# Patient Record
Sex: Female | Born: 1995 | Race: Black or African American | Hispanic: No | Marital: Single | Smoking: Current every day smoker
Health system: Southern US, Community
[De-identification: ages and names within clinical notes are randomized; demographics above are authoritative.]

## PROBLEM LIST (undated history)

## (undated) DIAGNOSIS — J45909 Unspecified asthma, uncomplicated: Secondary | ICD-10-CM

---
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
## 2016-07-20 ENCOUNTER — Emergency Department (HOSPITAL_COMMUNITY)
Admission: EM | Admit: 2016-07-20 | Discharge: 2016-07-20 | Disposition: A | Discharge status: Home / Self Care | Payer: Self-pay | Attending: Emergency Medicine | Admitting: Emergency Medicine

## 2016-07-20 ENCOUNTER — Encounter (HOSPITAL_COMMUNITY): Payer: Self-pay | Admitting: Emergency Medicine

## 2016-07-20 DIAGNOSIS — Y929 Unspecified place or not applicable: Secondary | ICD-10-CM | POA: Insufficient documentation

## 2016-07-20 DIAGNOSIS — W504XXA Accidental scratch by another person, initial encounter: Secondary | ICD-10-CM | POA: Insufficient documentation

## 2016-07-20 DIAGNOSIS — Y999 Unspecified external cause status: Secondary | ICD-10-CM | POA: Insufficient documentation

## 2016-07-20 DIAGNOSIS — J45909 Unspecified asthma, uncomplicated: Secondary | ICD-10-CM | POA: Insufficient documentation

## 2016-07-20 DIAGNOSIS — L03114 Cellulitis of left upper limb: Secondary | ICD-10-CM | POA: Insufficient documentation

## 2016-07-20 DIAGNOSIS — Y939 Activity, unspecified: Secondary | ICD-10-CM | POA: Insufficient documentation

## 2016-07-20 MED ORDER — CLINDAMYCIN HCL 150 MG PO CAPS: 150.0000 mg | ORAL_CAPSULE | Freq: Three times a day (TID) | ORAL | 0 refills | Status: DC

## 2016-07-20 MED ORDER — HYDROCODONE-ACETAMINOPHEN 5-325 MG PO TABS: 1.0000 | ORAL_TABLET | ORAL | 0 refills | Status: DC | PRN

## 2016-07-20 MED ORDER — CLINDAMYCIN HCL 150 MG PO CAPS
300.0000 mg | ORAL_CAPSULE | Freq: Once | ORAL | Status: AC
  Administered 2016-07-20: 300 mg via ORAL
  Filled 2016-07-20: qty 2

## 2016-07-20 MED ORDER — LIDOCAINE HCL (PF) 1 % IJ SOLN
5.0000 mL | Freq: Once | INTRAMUSCULAR | Status: DC
  Filled 2016-07-20: qty 5

## 2016-07-20 MED ORDER — ONDANSETRON 4 MG PO TBDP
4.0000 mg | ORAL_TABLET | Freq: Once | ORAL | Status: AC
  Administered 2016-07-20: 4 mg via ORAL
  Filled 2016-07-20: qty 1

## 2016-07-20 MED ORDER — OXYCODONE-ACETAMINOPHEN 5-325 MG PO TABS
1.0000 | ORAL_TABLET | Freq: Once | ORAL | Status: AC
  Administered 2016-07-20: 1 via ORAL
  Filled 2016-07-20: qty 1

## 2016-07-20 MED ORDER — SODIUM BICARBONATE 4 % IV SOLN
5.0000 mL | Freq: Once | INTRAVENOUS | Status: DC
  Filled 2016-07-20: qty 5

## 2016-07-20 NOTE — Progress Notes (Signed)
Orthopedic Tech Progress Note Patient Details:  Erika Benton 1996/05/15 [id number]  Ortho Devices Type of Ortho Device: Arm sling Ortho Device/Splint Interventions: Application   Saul FordyceJennifer C Hiliana Eilts 07/20/2016, 8:32 AM

## 2016-07-20 NOTE — Discharge Instructions (Signed)
You have a wound to the back of your hand that is likely infected after being scratched. At this time there does not appear to be fluid collected that I can drain. Please soak your hand in warm EPSOM salt baths multiple times a day. Take the antibiotic till it is complete even if the pain and swelling goes away. Take the pain medication as needed. If you feel like the area gets bigger or starts to spread or become more painful come back to the ER. If you start getting fevers or vomiting then that is also concern the infection may have spread and I want you to come back to the ER.

## 2016-07-20 NOTE — ED Triage Notes (Signed)
Pt reports two days ago getting scratched on the left hand by a fingernail. Two of the scratches are scabbed over and the third scratch is throbbing. Pt reports limited movement in left wrist and fingers due to pain. Radial pulse 4+, cap refill WNL.

## 2016-07-20 NOTE — ED Provider Notes (Signed)
[facility]-EMERGENCY DEPT Provider Note   CSN: [id number] Arrival date & time: 07/20/16  0636  History   Chief Complaint Chief Complaint  Patient presents with  . Hand Injury    HPI Erika Benton is a 21 y.o. female.  HPI  Patient with PMH of asthma comes to the ER with pain to the back of her hand with throbbing. She was scratched by human fingernails to the back of the left hand, they have all scabbed over but one of the scratches is throbbing. No drainage. No fevers, N/V/D, no pain up towards wrist or elbow.  Past Medical History:  Diagnosis Date  . Asthma     There are no active problems to display for this patient.   History reviewed. No pertinent surgical history.  OB History    No data available       Home Medications    Prior to Admission medications   Medication Sig Start Date End Date Taking? Authorizing Provider  clindamycin (CLEOCIN) 150 MG capsule Take 1 capsule (150 mg total) by mouth 3 (three) times daily. 07/20/16   Marlon Pel, PA-C  HYDROcodone-acetaminophen (NORCO/VICODIN) 5-325 MG tablet Take 1-2 tablets by mouth every 4 (four) hours as needed. 07/20/16   Marlon Pel, PA-C  trimethoprim-polymyxin b (POLYTRIM) ophthalmic solution Place 1 drop into the left eye every 6 (six) hours. X 7 days qs 03/28/14   Marcellina Millin, MD    Family History No family history on file.  Social History Social History  Substance Use Topics  . Smoking status: Never Smoker  . Smokeless tobacco: Never Used  . Alcohol use No     Allergies   Patient has no known allergies.   Review of Systems Review of Systems Review of Systems All other systems negative except as documented in the HPI. All pertinent positives and negatives as reviewed in the HPI.   Physical Exam Updated Vital Signs BP 93/60 (BP Location: Right Arm)   Pulse 88   Temp 98.3 F (36.8 C) (Oral)   Resp 16   Ht 5\' 7"  (1.702 m)   Wt 54.4 kg   LMP 07/13/2016   SpO2 100%   BMI 18.79  kg/m   Physical Exam  Constitutional: She appears well-developed and well-nourished. No distress.  HENT:  Head: Normocephalic and atraumatic.  Eyes: Pupils are equal, round, and reactive to light.  Neck: Normal range of motion. Neck supple.  Cardiovascular: Normal rate and regular rhythm.   Pulmonary/Chest: Effort normal.  Abdominal: Soft.  Musculoskeletal:  Pt has some tenderness swelling, erythema, and induration to posterior hand associated with one of the scratches. She has full extension and flexion of all 5 fingers but does have pain with movement of fingers. No pain to wrist or elbow. No fluctuance, wound is firm. Normal anterior hand. No swelling into fingers.  Neurological: She is alert.  Skin: Skin is warm and dry.  Nursing note and vitals reviewed.    ED Treatments / Results  Labs (all labs ordered are listed, but only abnormal results are displayed) Labs Reviewed - No data to display  EKG  EKG Interpretation None       Radiology No results found.  Procedures Procedures (including critical care time)  Medications Ordered in ED Medications  clindamycin (CLEOCIN) capsule 300 mg (300 mg Oral Given 07/20/16 0747)  ondansetron (ZOFRAN-ODT) disintegrating tablet 4 mg (4 mg Oral Given 07/20/16 0747)  oxyCODONE-acetaminophen (PERCOCET/ROXICET) 5-325 MG per tablet 1 tablet (1 tablet Oral Given 07/20/16  [id number])     Initial Impression / Assessment and Plan / ED Course  I have reviewed the triage vital signs and the nursing notes.  Pertinent labs & imaging results that were available during my care of the patient were reviewed by me and considered in my medical decision making (see chart for details).  Clinical Course     Will start on Clindamycin and have patient follow-up with hand with strict return to ED precautions. The wound does not appear amenable to drainage at this time with no obvious fluid collection. Will rx abx and pain medication.   Final Clinical  Impressions(s) / ED Diagnoses   Final diagnoses:  Cellulitis of left upper extremity    New Prescriptions New Prescriptions   CLINDAMYCIN (CLEOCIN) 150 MG CAPSULE    Take 1 capsule (150 mg total) by mouth 3 (three) times daily.   HYDROCODONE-ACETAMINOPHEN (NORCO/VICODIN) 5-325 MG TABLET    Take 1-2 tablets by mouth every 4 (four) hours as needed.     Marlon Peliffany Shunta Mclaurin, PA-C 07/20/16 [id number]    Gerhard Munchobert Lockwood, MD 07/20/16 [id number]

## 2017-09-04 ENCOUNTER — Encounter (HOSPITAL_COMMUNITY): Payer: Self-pay | Admitting: Emergency Medicine

## 2017-09-04 ENCOUNTER — Other Ambulatory Visit: Payer: Self-pay

## 2017-09-04 ENCOUNTER — Emergency Department (HOSPITAL_COMMUNITY)
Admission: EM | Admit: 2017-09-04 | Discharge: 2017-09-04 | Disposition: A | Discharge status: Home / Self Care | Payer: Self-pay | Attending: Emergency Medicine | Admitting: Emergency Medicine

## 2017-09-04 DIAGNOSIS — R102 Pelvic and perineal pain: Secondary | ICD-10-CM | POA: Insufficient documentation

## 2017-09-04 DIAGNOSIS — Z5321 Procedure and treatment not carried out due to patient leaving prior to being seen by health care provider: Secondary | ICD-10-CM | POA: Insufficient documentation

## 2017-09-04 LAB — I-STAT BETA HCG BLOOD, ED (MC, WL, AP ONLY)

## 2017-09-04 NOTE — ED Notes (Signed)
09/04/2017. 17:00, Attempted Follow-up call, no answer.

## 2017-09-04 NOTE — ED Triage Notes (Signed)
Patient with pelvic pain that started about one hour ago.  She denies any bleeding or discharge.  She states she was a little lightheaded with the pain on the way to the hospital.  She denies having sex at the time of the pain starting, she states she was "just chillin" when the pain started.

## 2017-09-04 NOTE — ED Notes (Signed)
Pts name called for a room no answer 

## 2017-09-04 NOTE — ED Notes (Signed)
Pt unable to void at this time. 

## 2018-11-25 ENCOUNTER — Emergency Department (HOSPITAL_COMMUNITY)
Admission: EM | Admit: 2018-11-25 | Discharge: 2018-11-25 | Disposition: A | Discharge status: Home / Self Care | Payer: Self-pay | Attending: Emergency Medicine | Admitting: Emergency Medicine

## 2018-11-25 ENCOUNTER — Encounter (HOSPITAL_COMMUNITY): Payer: Self-pay | Admitting: Emergency Medicine

## 2018-11-25 ENCOUNTER — Other Ambulatory Visit: Payer: Self-pay

## 2018-11-25 DIAGNOSIS — J45909 Unspecified asthma, uncomplicated: Secondary | ICD-10-CM | POA: Insufficient documentation

## 2018-11-25 DIAGNOSIS — Z79899 Other long term (current) drug therapy: Secondary | ICD-10-CM | POA: Insufficient documentation

## 2018-11-25 DIAGNOSIS — F17200 Nicotine dependence, unspecified, uncomplicated: Secondary | ICD-10-CM | POA: Insufficient documentation

## 2018-11-25 DIAGNOSIS — L02416 Cutaneous abscess of left lower limb: Secondary | ICD-10-CM | POA: Insufficient documentation

## 2018-11-25 MED ORDER — SODIUM BICARBONATE 4 % IV SOLN
5.0000 mL | Freq: Once | INTRAVENOUS | Status: AC
  Administered 2018-11-25: 5 mL via SUBCUTANEOUS
  Filled 2018-11-25: qty 5

## 2018-11-25 MED ORDER — LIDOCAINE HCL (PF) 1 % IJ SOLN
30.0000 mL | Freq: Once | INTRAMUSCULAR | Status: AC
  Administered 2018-11-25: 30 mL
  Filled 2018-11-25: qty 30

## 2018-11-25 NOTE — ED Notes (Signed)
All items at bedside for PA

## 2018-11-25 NOTE — ED Triage Notes (Signed)
Patient reports "popping bump" on left lower leg x5 days ago. Reports pain and swelling at site started x2 days ago.

## 2018-11-25 NOTE — ED Provider Notes (Signed)
[facility]-EMERGENCY DEPT Provider Note   CSN: [id number] Arrival date & time: 11/25/18  1953    History   Chief Complaint Chief Complaint  Patient presents with  . Leg Pain    HPI Erika Benton is a 23 y.o. female      Abscess  Location:  Leg Leg abscess location:  L leg Size:  8 cm Abscess quality: fluctuance, induration, painful, redness and warmth   Progression:  Worsening Pain details:    Quality:  Pressure   Severity:  Moderate   Timing:  Constant   Progression:  Worsening Chronicity:  New Context: skin injury   Context: not diabetes, not immunosuppression, not injected drug use and not insect bite/sting   Relieved by:  Nothing Ineffective treatments:  None tried Associated symptoms: no anorexia, no fatigue, no fever, no headaches, no nausea and no vomiting   Risk factors: no hx of MRSA and no prior abscess     Past Medical History:  Diagnosis Date  . Asthma     There are no active problems to display for this patient.   History reviewed. No pertinent surgical history.   OB History   No obstetric history on file.      Home Medications    Prior to Admission medications   Medication Sig Start Date End Date Taking? Authorizing Provider  clindamycin (CLEOCIN) 150 MG capsule Take 1 capsule (150 mg total) by mouth 3 (three) times daily. 07/20/16   Marlon PelGreene, Tiffany, PA-C  HYDROcodone-acetaminophen (NORCO/VICODIN) 5-325 MG tablet Take 1-2 tablets by mouth every 4 (four) hours as needed. 07/20/16   Marlon PelGreene, Tiffany, PA-C  trimethoprim-polymyxin b (POLYTRIM) ophthalmic solution Place 1 drop into the left eye every 6 (six) hours. X 7 days qs 03/28/14   Marcellina MillinGaley, Timothy, MD    Family History No family history on file.  Social History Social History   Tobacco Use  . Smoking status: Current Every Day Smoker  . Smokeless tobacco: Never Used  Substance Use Topics  . Alcohol use: No  . Drug use: Not on file     Allergies    Patient has no known allergies.   Review of Systems Review of Systems  Constitutional: Negative for chills, fatigue and fever.  Gastrointestinal: Negative for anorexia, nausea and vomiting.  Skin:       abscess  Neurological: Negative for headaches.     Physical Exam Updated Vital Signs BP 121/74 (BP Location: Right Arm)   Pulse 88   Temp 98.3 F (36.8 C) (Oral)   Resp 15   SpO2 100%   Physical Exam Vitals signs and nursing note reviewed.  Constitutional:      General: She is not in acute distress.    Appearance: She is well-developed. She is not diaphoretic.  HENT:     Head: Normocephalic and atraumatic.  Eyes:     General: No scleral icterus.    Conjunctiva/sclera: Conjunctivae normal.  Neck:     Musculoskeletal: Normal range of motion.  Cardiovascular:     Rate and Rhythm: Normal rate and regular rhythm.     Heart sounds: Normal heart sounds. No murmur. No friction rub. No gallop.   Pulmonary:     Effort: Pulmonary effort is normal. No respiratory distress.     Breath sounds: Normal breath sounds.  Abdominal:     General: Bowel sounds are normal. There is no distension.     Palpations: Abdomen is soft. There is no mass.  Tenderness: There is no abdominal tenderness. There is no guarding.  Skin:    General: Skin is warm and dry.     Capillary Refill: Capillary refill takes less than 2 seconds.     Comments: LLE 6 cm abscess with central fluctuance 1 cm of surrounding induratoin without lymphangitis.  Neurological:     Mental Status: She is alert and oriented to person, place, and time.  Psychiatric:        Behavior: Behavior normal.      ED Treatments / Results  Labs (all labs ordered are listed, but only abnormal results are displayed) Labs Reviewed - No data to display  EKG None  Radiology No results found.  Procedures Procedures (including critical care time)  Medications Ordered in ED Medications  sodium bicarbonate (NEUT) 4 %  injection 5 mL (has no administration in time range)  lidocaine (PF) (XYLOCAINE) 1 % injection 30 mL (has no administration in time range)     Initial Impression / Assessment and Plan / ED Course  I have reviewed the triage vital signs and the nursing notes.  Pertinent labs & imaging results that were available during my care of the patient were reviewed by me and considered in my medical decision making (see chart for details).        Patient with skin abscess amenable to incision and drainage.  Abscess was not large enough to warrant packing or drain,  wound recheck in 2 days. Encouraged home warm soaks and flushing.  Mild signs of cellulitis is surrounding skin.  Will d/c to home.  No antibiotic therapy is indicated.   Final Clinical Impressions(s) / ED Diagnoses   Final diagnoses:  Abscess of left lower extremity    ED Discharge Orders    None       Arthor Captain, PA-C 11/25/18 2327    Virgina Norfolk, DO 11/25/18 2331

## 2018-11-25 NOTE — Discharge Instructions (Signed)
Get help right away if: You have severe pain or bleeding. You cannot eat or drink without vomiting. You have decreased urine output. You become short of breath. You have chest pain. You cough up blood. The area where the incision and drainage occurred becomes numb or it tingles.   Contact a health care provider if you have: More redness, swelling, or pain around your abscess. More fluid or blood coming from your abscess. Warm skin around your abscess. More pus or a bad smell coming from your abscess. A fever. Muscle aches. Chills or a general ill feeling. Get help right away if you: Have severe pain. See red streaks on your skin spreading away from the abscess.

## 2018-11-25 NOTE — ED Notes (Signed)
ED Provider at bedside. 

## 2018-12-01 ENCOUNTER — Other Ambulatory Visit: Payer: Self-pay

## 2018-12-01 ENCOUNTER — Encounter (HOSPITAL_COMMUNITY): Payer: Self-pay

## 2018-12-01 ENCOUNTER — Emergency Department (HOSPITAL_COMMUNITY)
Admission: EM | Admit: 2018-12-01 | Discharge: 2018-12-01 | Disposition: A | Discharge status: Home / Self Care | Payer: Self-pay | Attending: Emergency Medicine | Admitting: Emergency Medicine

## 2018-12-01 DIAGNOSIS — F17200 Nicotine dependence, unspecified, uncomplicated: Secondary | ICD-10-CM | POA: Insufficient documentation

## 2018-12-01 DIAGNOSIS — L739 Follicular disorder, unspecified: Secondary | ICD-10-CM | POA: Insufficient documentation

## 2018-12-01 DIAGNOSIS — J45909 Unspecified asthma, uncomplicated: Secondary | ICD-10-CM | POA: Insufficient documentation

## 2018-12-01 MED ORDER — DOXYCYCLINE HYCLATE 100 MG PO CAPS: 100.0000 mg | ORAL_CAPSULE | Freq: Two times a day (BID) | ORAL | 0 refills | Status: DC

## 2018-12-01 NOTE — ED Triage Notes (Signed)
Pt states that she has another "bump" similar to the one that she had drained the other day. Today's "bump" is on her right lower leg. Pt states no pain, just doesn't want it to get as bad as the other leg.

## 2018-12-01 NOTE — ED Provider Notes (Signed)
[facility]-EMERGENCY DEPT Provider Note   CSN: [id number] Arrival date & time: 12/01/18  1120    History   Chief Complaint Chief Complaint  Patient presents with  . Abscess    HPI Erika Benton is a 23 y.o. female.     HPI Patient is a 23 year old female presents to the emergency department with a questionable developing abscess of her right lateral mid lower leg.  She states she recently had an abscess on her left leg drained and wanted this 1 evaluated before it became larger in size.  No drainage.  No fevers or chills.  No spreading redness.  No trauma or injury.  Symptoms are mild in severity.   Past Medical History:  Diagnosis Date  . Asthma     There are no active problems to display for this patient.   History reviewed. No pertinent surgical history.   OB History   No obstetric history on file.      Home Medications    Prior to Admission medications   Medication Sig Start Date End Date Taking? Authorizing Provider  doxycycline (VIBRAMYCIN) 100 MG capsule Take 1 capsule (100 mg total) by mouth 2 (two) times daily. 12/01/18   Azalia Bilis, MD    Family History No family history on file.  Social History Social History   Tobacco Use  . Smoking status: Current Every Day Smoker  . Smokeless tobacco: Never Used  Substance Use Topics  . Alcohol use: No  . Drug use: Not on file     Allergies   Patient has no known allergies.   Review of Systems Review of Systems  All other systems reviewed and are negative.    Physical Exam Updated Vital Signs BP 114/74 (BP Location: Right Arm)   Pulse 79   Temp 98.1 F (36.7 C) (Oral)   Resp 14   Ht 5\' 8"  (1.727 m)   Wt 54.4 kg   SpO2 100%   BMI 18.25 kg/m   Physical Exam Vitals signs and nursing note reviewed.  Constitutional:      Appearance: She is well-developed.  HENT:     Head: Normocephalic.  Neck:     Musculoskeletal: Normal range of motion.  Pulmonary:      Effort: Pulmonary effort is normal.  Abdominal:     General: There is no distension.  Musculoskeletal: Normal range of motion.     Comments: Superficial folliculitis of the right lower lateral mid leg.  Small purulent containing pustule overlying this.  No spreading erythema or warmth  Neurological:     Mental Status: She is alert and oriented to person, place, and time.      ED Treatments / Results  Labs (all labs ordered are listed, but only abnormal results are displayed) Labs Reviewed - No data to display  EKG None  Radiology No results found.  Procedures .Marland KitchenIncision and Drainage Performed by: Azalia Bilis, MD Authorized by: Azalia Bilis, MD     INCISION AND DRAINAGE Performed by: Azalia Bilis Consent: Verbal consent obtained. Risks and benefits: risks, benefits and alternatives were discussed Time out performed prior to procedure Type: abscess Body area: Right lateral leg  anesthesia: None  incision was made with an 18-gauge needle. Local anesthetic: Complexity: Simple Drainage: purulent Drainage amount: Small Packing material: None Patient tolerance: Patient tolerated the procedure well with no immediate complications.     Medications Ordered in ED Medications - No data to display   Initial Impression / Assessment  and Plan / ED Course  I have reviewed the triage vital signs and the nursing notes.  Pertinent labs & imaging results that were available during my care of the patient were reviewed by me and considered in my medical decision making (see chart for details).        Simple purulent filled pustule was opened with an 18-gauge needle.  Patient tolerated the procedure well.  Warm compress and antibiotics.  Final Clinical Impressions(s) / ED Diagnoses   Final diagnoses:  Folliculitis    ED Discharge Orders         Ordered    doxycycline (VIBRAMYCIN) 100 MG capsule  2 times daily     12/01/18 1205           Azalia Bilisampos, Leif Loflin, MD  12/01/18 1207

## 2018-12-01 NOTE — Discharge Instructions (Addendum)
Apply warm compresses to the area three times a day

## 2019-05-08 ENCOUNTER — Encounter (HOSPITAL_COMMUNITY): Payer: Self-pay | Admitting: Emergency Medicine

## 2019-05-08 ENCOUNTER — Emergency Department (HOSPITAL_COMMUNITY)
Admission: EM | Admit: 2019-05-08 | Discharge: 2019-05-08 | Disposition: A | Discharge status: Home / Self Care | Payer: Self-pay | Attending: Emergency Medicine | Admitting: Emergency Medicine

## 2019-05-08 ENCOUNTER — Emergency Department (HOSPITAL_COMMUNITY): Payer: Self-pay

## 2019-05-08 ENCOUNTER — Other Ambulatory Visit: Payer: Self-pay

## 2019-05-08 DIAGNOSIS — J45909 Unspecified asthma, uncomplicated: Secondary | ICD-10-CM | POA: Insufficient documentation

## 2019-05-08 DIAGNOSIS — Y929 Unspecified place or not applicable: Secondary | ICD-10-CM | POA: Insufficient documentation

## 2019-05-08 DIAGNOSIS — S92252B Displaced fracture of navicular [scaphoid] of left foot, initial encounter for open fracture: Secondary | ICD-10-CM | POA: Insufficient documentation

## 2019-05-08 DIAGNOSIS — S92212B Displaced fracture of cuboid bone of left foot, initial encounter for open fracture: Secondary | ICD-10-CM | POA: Insufficient documentation

## 2019-05-08 DIAGNOSIS — Y999 Unspecified external cause status: Secondary | ICD-10-CM | POA: Insufficient documentation

## 2019-05-08 DIAGNOSIS — S91332A Puncture wound without foreign body, left foot, initial encounter: Secondary | ICD-10-CM | POA: Insufficient documentation

## 2019-05-08 DIAGNOSIS — F17210 Nicotine dependence, cigarettes, uncomplicated: Secondary | ICD-10-CM | POA: Insufficient documentation

## 2019-05-08 DIAGNOSIS — Y939 Activity, unspecified: Secondary | ICD-10-CM | POA: Insufficient documentation

## 2019-05-08 DIAGNOSIS — W3400XA Accidental discharge from unspecified firearms or gun, initial encounter: Secondary | ICD-10-CM

## 2019-05-08 LAB — CBC WITH DIFFERENTIAL/PLATELET
Abs Immature Granulocytes: 0.01 10*3/uL (ref 0.00–0.07)
Basophils Absolute: 0.1 10*3/uL (ref 0.0–0.1)
Basophils Relative: 1 %
Eosinophils Absolute: 0.3 10*3/uL (ref 0.0–0.5)
Eosinophils Relative: 3 %
HCT: 43.8 % (ref 36.0–46.0)
Hemoglobin: 15.5 g/dL — ABNORMAL HIGH (ref 12.0–15.0)
Immature Granulocytes: 0 %
Lymphocytes Relative: 51 %
Lymphs Abs: 4.8 10*3/uL — ABNORMAL HIGH (ref 0.7–4.0)
MCH: 34.8 pg — ABNORMAL HIGH (ref 26.0–34.0)
MCHC: 35.4 g/dL (ref 30.0–36.0)
MCV: 98.2 fL (ref 80.0–100.0)
Monocytes Absolute: 0.4 10*3/uL (ref 0.1–1.0)
Monocytes Relative: 5 %
Neutro Abs: 3.6 10*3/uL (ref 1.7–7.7)
Neutrophils Relative %: 40 %
Platelets: 144 10*3/uL — ABNORMAL LOW (ref 150–400)
RBC: 4.46 MIL/uL (ref 3.87–5.11)
RDW: 12.4 % (ref 11.5–15.5)
WBC: 6.9 10*3/uL (ref 4.0–10.5)
nRBC: 0 % (ref 0.0–0.2)

## 2019-05-08 LAB — I-STAT BETA HCG BLOOD, ED (MC, WL, AP ONLY): I-stat hCG, quantitative: <5 m[IU]/mL (ref <5)

## 2019-05-08 LAB — BASIC METABOLIC PANEL
Anion gap: 15 (ref 5–15)
BUN: 11 mg/dL (ref 6–20)
CO2: 18 mmol/L — ABNORMAL LOW (ref 22–32)
Calcium: 9.2 mg/dL (ref 8.9–10.3)
Chloride: 105 mmol/L (ref 98–111)
Creatinine, Ser: 0.86 mg/dL (ref 0.44–1.00)
GFR calc Af Amer: >60 mL/min (ref >60)
GFR calc non Af Amer: >60 mL/min (ref >60)
Glucose, Bld: 169 mg/dL — ABNORMAL HIGH (ref 70–99)
Potassium: 3.1 mmol/L — ABNORMAL LOW (ref 3.5–5.1)
Sodium: 138 mmol/L (ref 135–145)

## 2019-05-08 MED ORDER — HYDROCODONE-ACETAMINOPHEN 5-325 MG PO TABS: 1.0000 | ORAL_TABLET | ORAL | 0 refills | Status: DC | PRN

## 2019-05-08 MED ORDER — OXYCODONE-ACETAMINOPHEN 5-325 MG PO TABS
1.0000 | ORAL_TABLET | Freq: Once | ORAL | Status: AC
  Administered 2019-05-08: 1 via ORAL
  Filled 2019-05-08: qty 1

## 2019-05-08 MED ORDER — HYDROCODONE-ACETAMINOPHEN 5-325 MG PO TABS
1.0000 | ORAL_TABLET | Freq: Once | ORAL | Status: AC
  Administered 2019-05-08: 1 via ORAL
  Filled 2019-05-08: qty 1

## 2019-05-08 NOTE — ED Notes (Signed)
Pt verbalized understanding of d/c instruction, scripts, follow up care and s/s requiring return to ED. No further questions at this time.

## 2019-05-08 NOTE — ED Notes (Signed)
Pt wound actively bleeding. Pressure dressing with ace bandage reapplied following MD assessment of wound.
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
## 2019-05-08 NOTE — Discharge Instructions (Addendum)
No weight bearing on your left foot - use crutches.  Keep your foot elevated as much as possible.  You may take ibuprofen 400 mg for less severe pain, or along with hydrocodone-acetaminophen for additional pain relief.
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
## 2019-05-10 ENCOUNTER — Encounter: Payer: Self-pay | Admitting: Orthopedic Surgery

## 2019-05-10 ENCOUNTER — Other Ambulatory Visit: Payer: Self-pay

## 2019-05-10 ENCOUNTER — Other Ambulatory Visit: Payer: Self-pay | Admitting: Physician Assistant

## 2019-05-10 ENCOUNTER — Ambulatory Visit (INDEPENDENT_AMBULATORY_CARE_PROVIDER_SITE_OTHER): Payer: Self-pay | Admitting: Orthopedic Surgery

## 2019-05-10 VITALS — Ht 68.0 in | Wt 120.0 lb

## 2019-05-10 DIAGNOSIS — S92902B Unspecified fracture of left foot, initial encounter for open fracture: Secondary | ICD-10-CM

## 2019-05-10 DIAGNOSIS — W3400XA Accidental discharge from unspecified firearms or gun, initial encounter: Secondary | ICD-10-CM

## 2019-05-10 DIAGNOSIS — S91332A Puncture wound without foreign body, left foot, initial encounter: Secondary | ICD-10-CM

## 2019-05-10 MED ORDER — HYDROCODONE-ACETAMINOPHEN 5-325 MG PO TABS: 1.0000 | ORAL_TABLET | ORAL | 0 refills | Status: DC | PRN

## 2019-05-10 MED ORDER — DOXYCYCLINE HYCLATE 100 MG PO TABS: 100.0000 mg | ORAL_TABLET | Freq: Two times a day (BID) | ORAL | 1 refills | Status: DC
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
## 2019-05-11 ENCOUNTER — Encounter (HOSPITAL_COMMUNITY): Payer: Self-pay | Admitting: *Deleted

## 2019-05-11 ENCOUNTER — Telehealth: Payer: Self-pay | Admitting: Physician Assistant

## 2019-05-11 NOTE — Telephone Encounter (Signed)
Received call from [facility] -pharmacist with [place] she advised patient just wanted to pick up the pain medicine and not the Doxycycline. The pharmacist told patient she needed to pick up both Rx's.  Said asked if it is ok for the patient to only pick up the Hydrocodone? The number to contact Michela Pitcher is [phone]
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
## 2019-05-11 NOTE — Telephone Encounter (Signed)
Pharmacy  was called and notified per Dr Jess Barters orders states patient should pick up both orders from pharmacy.

## 2019-05-12 ENCOUNTER — Encounter (HOSPITAL_COMMUNITY): Payer: Self-pay

## 2019-05-12 ENCOUNTER — Inpatient Hospital Stay (HOSPITAL_COMMUNITY): Payer: Self-pay | Admitting: Certified Registered"

## 2019-05-12 ENCOUNTER — Other Ambulatory Visit (HOSPITAL_COMMUNITY)
Admission: RE | Admit: 2019-05-12 | Discharge: 2019-05-12 | Disposition: A | Discharge status: Home / Self Care | Payer: Self-pay | Source: Ambulatory Visit | Attending: Orthopedic Surgery | Admitting: Orthopedic Surgery

## 2019-05-12 ENCOUNTER — Encounter (HOSPITAL_COMMUNITY)
Admission: RE | Disposition: A | Discharge status: Home / Self Care | Payer: Self-pay | Source: Home / Self Care | Attending: Orthopedic Surgery

## 2019-05-12 ENCOUNTER — Other Ambulatory Visit: Payer: Self-pay

## 2019-05-12 ENCOUNTER — Inpatient Hospital Stay (HOSPITAL_COMMUNITY)
Admission: RE | Admit: 2019-05-12 | Discharge: 2019-05-14 | DRG: 505 | Disposition: A | Discharge status: Home / Self Care | Payer: Self-pay | Attending: Orthopedic Surgery | Admitting: Orthopedic Surgery

## 2019-05-12 DIAGNOSIS — W320XXA Accidental handgun discharge, initial encounter: Secondary | ICD-10-CM

## 2019-05-12 DIAGNOSIS — F17210 Nicotine dependence, cigarettes, uncomplicated: Secondary | ICD-10-CM | POA: Diagnosis present

## 2019-05-12 DIAGNOSIS — S92902A Unspecified fracture of left foot, initial encounter for closed fracture: Secondary | ICD-10-CM | POA: Diagnosis present

## 2019-05-12 DIAGNOSIS — S92122A Displaced fracture of body of left talus, initial encounter for closed fracture: Secondary | ICD-10-CM | POA: Diagnosis present

## 2019-05-12 DIAGNOSIS — J45909 Unspecified asthma, uncomplicated: Secondary | ICD-10-CM | POA: Diagnosis present

## 2019-05-12 DIAGNOSIS — S92252A Displaced fracture of navicular [scaphoid] of left foot, initial encounter for closed fracture: Principal | ICD-10-CM | POA: Diagnosis present

## 2019-05-12 DIAGNOSIS — S92902B Unspecified fracture of left foot, initial encounter for open fracture: Secondary | ICD-10-CM

## 2019-05-12 DIAGNOSIS — Z20828 Contact with and (suspected) exposure to other viral communicable diseases: Secondary | ICD-10-CM | POA: Diagnosis present

## 2019-05-12 DIAGNOSIS — S92212A Displaced fracture of cuboid bone of left foot, initial encounter for closed fracture: Secondary | ICD-10-CM | POA: Diagnosis present

## 2019-05-12 HISTORY — PX: ORIF ANKLE FRACTURE: SHX5408

## 2019-05-12 HISTORY — PX: APPLICATION OF WOUND VAC: SHX5189

## 2019-05-12 LAB — POCT PREGNANCY, URINE: Preg Test, Ur: NEGATIVE

## 2019-05-12 LAB — SARS CORONAVIRUS 2 BY RT PCR (HOSPITAL ORDER, PERFORMED IN ~~LOC~~ HOSPITAL LAB): SARS Coronavirus 2: NEGATIVE

## 2019-05-12 SURGERY — OPEN REDUCTION INTERNAL FIXATION (ORIF) ANKLE FRACTURE
Anesthesia: General | Site: Foot | Laterality: Left

## 2019-05-12 MED ORDER — ONDANSETRON HCL 4 MG PO TABS: 4.0000 mg | ORAL_TABLET | Freq: Four times a day (QID) | ORAL | Status: DC | PRN

## 2019-05-12 MED ORDER — OXYCODONE HCL 5 MG PO TABS
5.0000 mg | ORAL_TABLET | ORAL | Status: DC | PRN
  Administered 2019-05-13: 07:00:00 5 mg via ORAL
  Filled 2019-05-12: qty 1

## 2019-05-12 MED ORDER — CEFAZOLIN SODIUM-DEXTROSE 2-4 GM/100ML-% IV SOLN
2.0000 g | INTRAVENOUS | Status: AC
  Administered 2019-05-12: 2 g via INTRAVENOUS

## 2019-05-12 MED ORDER — MIDAZOLAM HCL 2 MG/2ML IJ SOLN
2.0000 mg | Freq: Once | INTRAMUSCULAR | Status: AC
  Administered 2019-05-12: 2 mg via INTRAVENOUS

## 2019-05-12 MED ORDER — CEFAZOLIN SODIUM-DEXTROSE 1-4 GM/50ML-% IV SOLN
1.0000 g | Freq: Four times a day (QID) | INTRAVENOUS | Status: AC
  Administered 2019-05-12 – 2019-05-13 (×2): 1 g via INTRAVENOUS
  Filled 2019-05-12 (×3): qty 50

## 2019-05-12 MED ORDER — FENTANYL CITRATE (PF) 100 MCG/2ML IJ SOLN
INTRAMUSCULAR | Status: AC
  Filled 2019-05-12: qty 2

## 2019-05-12 MED ORDER — ACETAMINOPHEN 500 MG PO TABS: 1000.0000 mg | ORAL_TABLET | Freq: Once | ORAL | Status: DC

## 2019-05-12 MED ORDER — METOCLOPRAMIDE HCL 5 MG PO TABS: 5.0000 mg | ORAL_TABLET | Freq: Three times a day (TID) | ORAL | Status: DC | PRN

## 2019-05-12 MED ORDER — METHOCARBAMOL 500 MG PO TABS
500.0000 mg | ORAL_TABLET | Freq: Four times a day (QID) | ORAL | Status: DC | PRN
  Administered 2019-05-13 – 2019-05-14 (×2): 500 mg via ORAL
  Filled 2019-05-12 (×3): qty 1

## 2019-05-12 MED ORDER — PHENYLEPHRINE HCL (PRESSORS) 10 MG/ML IV SOLN
INTRAVENOUS | Status: DC | PRN
  Administered 2019-05-12 (×2): 80 ug via INTRAVENOUS

## 2019-05-12 MED ORDER — ONDANSETRON HCL 4 MG/2ML IJ SOLN: 4.0000 mg | Freq: Four times a day (QID) | INTRAMUSCULAR | Status: DC | PRN

## 2019-05-12 MED ORDER — BUPIVACAINE-EPINEPHRINE (PF) 0.5% -1:200000 IJ SOLN
INTRAMUSCULAR | Status: DC | PRN
  Administered 2019-05-12: 10 mL via PERINEURAL
  Administered 2019-05-12: 15 mL via PERINEURAL

## 2019-05-12 MED ORDER — POVIDONE-IODINE 10 % EX SWAB: 2.0000 "application " | Freq: Once | CUTANEOUS | Status: DC

## 2019-05-12 MED ORDER — OXYCODONE HCL 5 MG PO TABS
10.0000 mg | ORAL_TABLET | ORAL | Status: DC | PRN
  Administered 2019-05-13 – 2019-05-14 (×6): 15 mg via ORAL
  Filled 2019-05-12 (×6): qty 3

## 2019-05-12 MED ORDER — FENTANYL CITRATE (PF) 100 MCG/2ML IJ SOLN
INTRAMUSCULAR | Status: DC | PRN
  Administered 2019-05-12 (×2): 50 ug via INTRAVENOUS

## 2019-05-12 MED ORDER — ACETAMINOPHEN 325 MG PO TABS: 325.0000 mg | ORAL_TABLET | Freq: Four times a day (QID) | ORAL | Status: DC | PRN

## 2019-05-12 MED ORDER — LACTATED RINGERS IV SOLN: INTRAVENOUS | Status: DC

## 2019-05-12 MED ORDER — LACTATED RINGERS IV SOLN
INTRAVENOUS | Status: DC
  Administered 2019-05-12 (×2): via INTRAVENOUS

## 2019-05-12 MED ORDER — MAGNESIUM CITRATE PO SOLN: 1.0000 | Freq: Once | ORAL | Status: DC | PRN

## 2019-05-12 MED ORDER — PROPOFOL 10 MG/ML IV BOLUS
INTRAVENOUS | Status: DC | PRN
  Administered 2019-05-12: 40 mg via INTRAVENOUS
  Administered 2019-05-12: 120 mg via INTRAVENOUS
  Administered 2019-05-12: 40 mg via INTRAVENOUS

## 2019-05-12 MED ORDER — PROMETHAZINE HCL 25 MG/ML IJ SOLN: 6.2500 mg | INTRAMUSCULAR | Status: DC | PRN

## 2019-05-12 MED ORDER — BISACODYL 10 MG RE SUPP: 10.0000 mg | Freq: Every day | RECTAL | Status: DC | PRN

## 2019-05-12 MED ORDER — ONDANSETRON HCL 4 MG/2ML IJ SOLN
INTRAMUSCULAR | Status: DC | PRN
  Administered 2019-05-12: 4 mg via INTRAVENOUS

## 2019-05-12 MED ORDER — LIDOCAINE 2% (20 MG/ML) 5 ML SYRINGE
INTRAMUSCULAR | Status: AC
  Filled 2019-05-12: qty 10

## 2019-05-12 MED ORDER — METOCLOPRAMIDE HCL 5 MG/ML IJ SOLN: 5.0000 mg | Freq: Three times a day (TID) | INTRAMUSCULAR | Status: DC | PRN

## 2019-05-12 MED ORDER — SODIUM CHLORIDE 0.9 % IV SOLN
INTRAVENOUS | Status: DC
  Administered 2019-05-12: 21:00:00 via INTRAVENOUS

## 2019-05-12 MED ORDER — OXYCODONE HCL 5 MG/5ML PO SOLN: 5.0000 mg | Freq: Once | ORAL | Status: DC | PRN

## 2019-05-12 MED ORDER — FENTANYL CITRATE (PF) 100 MCG/2ML IJ SOLN: 25.0000 ug | INTRAMUSCULAR | Status: DC | PRN

## 2019-05-12 MED ORDER — HYDROMORPHONE HCL 1 MG/ML IJ SOLN
0.5000 mg | INTRAMUSCULAR | Status: DC | PRN
  Administered 2019-05-13: 09:00:00 1 mg via INTRAVENOUS
  Filled 2019-05-12: qty 1

## 2019-05-12 MED ORDER — FENTANYL CITRATE (PF) 250 MCG/5ML IJ SOLN
INTRAMUSCULAR | Status: AC
  Filled 2019-05-12: qty 5

## 2019-05-12 MED ORDER — DEXAMETHASONE SODIUM PHOSPHATE 4 MG/ML IJ SOLN
INTRAMUSCULAR | Status: DC | PRN
  Administered 2019-05-12: 10 mg via INTRAVENOUS

## 2019-05-12 MED ORDER — SODIUM CHLORIDE 0.9 % IR SOLN
Status: DC | PRN
  Administered 2019-05-12: 1000 mL

## 2019-05-12 MED ORDER — CEFAZOLIN SODIUM-DEXTROSE 2-4 GM/100ML-% IV SOLN
INTRAVENOUS | Status: AC
  Filled 2019-05-12: qty 100

## 2019-05-12 MED ORDER — POLYETHYLENE GLYCOL 3350 17 G PO PACK: 17.0000 g | PACK | Freq: Every day | ORAL | Status: DC | PRN

## 2019-05-12 MED ORDER — LIDOCAINE 2% (20 MG/ML) 5 ML SYRINGE
INTRAMUSCULAR | Status: DC | PRN
  Administered 2019-05-12: 60 mg via INTRAVENOUS

## 2019-05-12 MED ORDER — FENTANYL CITRATE (PF) 100 MCG/2ML IJ SOLN
50.0000 ug | Freq: Once | INTRAMUSCULAR | Status: AC
  Administered 2019-05-12: 50 ug via INTRAVENOUS

## 2019-05-12 MED ORDER — CHLORHEXIDINE GLUCONATE 4 % EX LIQD: 60.0000 mL | Freq: Once | CUTANEOUS | Status: DC

## 2019-05-12 MED ORDER — OXYCODONE HCL 5 MG PO TABS: 5.0000 mg | ORAL_TABLET | Freq: Once | ORAL | Status: DC | PRN

## 2019-05-12 MED ORDER — METHOCARBAMOL 1000 MG/10ML IJ SOLN
500.0000 mg | Freq: Four times a day (QID) | INTRAVENOUS | Status: DC | PRN
  Filled 2019-05-12: qty 5

## 2019-05-12 MED ORDER — MIDAZOLAM HCL 2 MG/2ML IJ SOLN
INTRAMUSCULAR | Status: AC
  Filled 2019-05-12: qty 2

## 2019-05-12 MED ORDER — DOCUSATE SODIUM 100 MG PO CAPS
100.0000 mg | ORAL_CAPSULE | Freq: Two times a day (BID) | ORAL | Status: DC
  Administered 2019-05-13 – 2019-05-14 (×2): 100 mg via ORAL
  Filled 2019-05-12 (×4): qty 1

## 2019-05-12 SURGICAL SUPPLY — 51 items
BANDAGE ESMARK 6X9 LF (GAUZE/BANDAGES/DRESSINGS) IMPLANT
BIT DRILL 1.7 (BIT) ×3
BIT DRILL 1.7X (BIT) IMPLANT
BNDG CMPR 9X6 STRL LF SNTH (GAUZE/BANDAGES/DRESSINGS)
BNDG COHESIVE 4X5 TAN STRL (GAUZE/BANDAGES/DRESSINGS) ×5 IMPLANT
BNDG ESMARK 6X9 LF (GAUZE/BANDAGES/DRESSINGS)
BNDG GAUZE ELAST 4 BULKY (GAUZE/BANDAGES/DRESSINGS) ×3 IMPLANT
COVER SURGICAL LIGHT HANDLE (MISCELLANEOUS) ×3 IMPLANT
COVER WAND RF STERILE (DRAPES) ×3 IMPLANT
DRAPE INCISE IOBAN 66X45 STRL (DRAPES) ×2 IMPLANT
DRAPE OEC MINIVIEW 54X84 (DRAPES) ×2 IMPLANT
DRAPE U-SHAPE 47X51 STRL (DRAPES) ×3 IMPLANT
DRESSING PREVENA PLUS CUSTOM (GAUZE/BANDAGES/DRESSINGS) IMPLANT
DRSG ADAPTIC 3X8 NADH LF (GAUZE/BANDAGES/DRESSINGS) ×3 IMPLANT
DRSG PAD ABDOMINAL 8X10 ST (GAUZE/BANDAGES/DRESSINGS) ×3 IMPLANT
DRSG PREVENA PLUS CUSTOM (GAUZE/BANDAGES/DRESSINGS) ×3
DURAPREP 26ML APPLICATOR (WOUND CARE) ×3 IMPLANT
ELECT REM PT RETURN 9FT ADLT (ELECTROSURGICAL) ×3
ELECTRODE REM PT RTRN 9FT ADLT (ELECTROSURGICAL) ×1 IMPLANT
GAUZE SPONGE 4X4 12PLY STRL (GAUZE/BANDAGES/DRESSINGS) ×3 IMPLANT
GLOVE BIOGEL PI IND STRL 9 (GLOVE) ×1 IMPLANT
GLOVE BIOGEL PI INDICATOR 9 (GLOVE) ×2
GLOVE SURG ORTHO 9.0 STRL STRW (GLOVE) ×3 IMPLANT
GOWN STRL REUS W/ TWL XL LVL3 (GOWN DISPOSABLE) ×3 IMPLANT
GOWN STRL REUS W/TWL XL LVL3 (GOWN DISPOSABLE) ×9
KIT BASIN OR (CUSTOM PROCEDURE TRAY) ×3 IMPLANT
KIT TURNOVER KIT B (KITS) ×3 IMPLANT
MANIFOLD NEPTUNE II (INSTRUMENTS) ×3 IMPLANT
NDL SPNL 18GX3.5 QUINCKE PK (NEEDLE) IMPLANT
NEEDLE SPNL 18GX3.5 QUINCKE PK (NEEDLE) ×3 IMPLANT
NS IRRIG 1000ML POUR BTL (IV SOLUTION) ×3 IMPLANT
PACK ORTHO EXTREMITY (CUSTOM PROCEDURE TRAY) ×3 IMPLANT
PAD ARMBOARD 7.5X6 YLW CONV (MISCELLANEOUS) ×6 IMPLANT
PLATE CAGE 2.4 2X5H (Plate) ×2 IMPLANT
PUTTY DBM ALLOSYNC PURE 5CC (Putty) ×2 IMPLANT
SCREW LP CORTEX 2.4X18 (Screw) ×4 IMPLANT
SCREW LP CORTEX 2.4X20MM (Screw) ×6 IMPLANT
SCREW LP LOCKING 2.4 X 16 (Screw) ×6 IMPLANT
SCREW NONLOCKING 2.4X16MM (Screw) ×2 IMPLANT
SCREW NONLOCKING 2.4X24MM (Screw) ×2 IMPLANT
STAPLE SUPERMX NITI 20X20 (Staple) ×2 IMPLANT
STAPLER VISISTAT 35W (STAPLE) IMPLANT
SUCTION FRAZIER HANDLE 10FR (MISCELLANEOUS) ×2
SUCTION TUBE FRAZIER 10FR DISP (MISCELLANEOUS) ×1 IMPLANT
SUT ETHILON 2 0 PSLX (SUTURE) ×4 IMPLANT
SUT VIC AB 2-0 CT1 27 (SUTURE) ×3
SUT VIC AB 2-0 CT1 TAPERPNT 27 (SUTURE) ×1 IMPLANT
TOWEL GREEN STERILE (TOWEL DISPOSABLE) ×3 IMPLANT
TOWEL GREEN STERILE FF (TOWEL DISPOSABLE) ×3 IMPLANT
TUBE CONNECTING 12'X1/4 (SUCTIONS) ×1
TUBE CONNECTING 12X1/4 (SUCTIONS) ×2 IMPLANT

## 2019-05-12 NOTE — Anesthesia Procedure Notes (Signed)
Procedure Name: LMA Insertion Date/Time: 05/12/2019 4:30 PM Performed by: Lance Coon, CRNA Pre-anesthesia Checklist: Patient identified, Emergency Drugs available, Suction available, Patient being monitored and Timeout performed Patient Re-evaluated:Patient Re-evaluated prior to induction Oxygen Delivery Method: Circle system utilized Preoxygenation: Pre-oxygenation with 100% oxygen Induction Type: IV induction LMA: LMA inserted LMA Size: 4.0 Number of attempts: 1 Placement Confirmation: positive ETCO2 and breath sounds checked- equal and bilateral Tube secured with: Tape Dental Injury: Teeth and Oropharynx as per pre-operative assessment

## 2019-05-12 NOTE — Anesthesia Postprocedure Evaluation (Signed)
Anesthesia Post Note  Patient: Erika Benton  Procedure(s) Performed: OPEN REDUCTION INTERNAL FIXATION (ORIF) LEFT FOOT (Left Foot)     Patient location during evaluation: PACU Anesthesia Type: General Level of consciousness: awake and alert and oriented Pain management: pain level controlled Vital Signs Assessment: post-procedure vital signs reviewed and stable Respiratory status: spontaneous breathing, nonlabored ventilation and respiratory function stable Cardiovascular status: blood pressure returned to baseline Postop Assessment: no apparent nausea or vomiting Anesthetic complications: no                  Brennan Bailey
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
## 2019-05-12 NOTE — Transfer of Care (Signed)
Immediate Anesthesia Transfer of Care Note  Patient: Erika Benton  Procedure(s) Performed: OPEN REDUCTION INTERNAL FIXATION (ORIF) LEFT FOOT (Left Ankle)  Patient Location: PACU  Anesthesia Type:GA combined with regional for post-op pain  Level of Consciousness: awake, alert  and oriented  Airway & Oxygen Therapy: Patient Spontanous Breathing and Patient connected to nasal cannula oxygen  Post-op Assessment: Report given to RN and Post -op Vital signs reviewed and stable  Post vital signs: Reviewed and stable  Last Vitals:  Vitals Value Taken Time  BP 119/72 05/12/19 1752  Temp    Pulse 80 05/12/19 1755  Resp 18 05/12/19 1755  SpO2 100 % 05/12/19 1755  Vitals shown include unvalidated device data.  Last Pain:  Vitals:   05/12/19 1213  TempSrc:   PainSc: 0-No pain         Complications: No apparent anesthesia complications
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
## 2019-05-13 ENCOUNTER — Telehealth: Payer: Self-pay | Admitting: Orthopedic Surgery

## 2019-05-13 DIAGNOSIS — S92902A Unspecified fracture of left foot, initial encounter for closed fracture: Secondary | ICD-10-CM | POA: Diagnosis present

## 2019-05-13 MED ORDER — OXYCODONE HCL 10 MG PO TABS: 10.0000 mg | ORAL_TABLET | ORAL | 0 refills | Status: DC | PRN

## 2019-05-13 NOTE — Plan of Care (Signed)
  Problem: Pain Managment: Goal: General experience of comfort will improve Outcome: Progressing   

## 2019-05-13 NOTE — Care Management (Signed)
CM consult acknowledged to assist with any HH/DME needs. Awaiting PT/OT eval for DCP recommendations and will continue to follow.  Neha Waight RN, BSN, NCM-BC, ACM-RN [phone] 
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
## 2019-05-13 NOTE — Telephone Encounter (Signed)
Patient's mother Erika Benton) called asked if she can get a call back concerning the wound vac not working. She asked if a working wound vac can be set up for her daughter. [name] said the nurse told her daughter that the fluid is backing up in her foot. The number to contact Erika Benton is [phone]
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
## 2019-05-13 NOTE — Progress Notes (Signed)
Orthopedic Tech Progress Note Patient Details:  Erika Benton May 28, 1996 [id number]  Ortho Devices Type of Ortho Device: CAM walker Ortho Device/Splint Location: left Ortho Device/Splint Interventions: Application   Post Interventions Patient Tolerated: Well Instructions Provided: Care of device   [place] Pink 05/13/2019, 10:47 AM
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
## 2019-05-14 ENCOUNTER — Encounter (HOSPITAL_COMMUNITY): Payer: Self-pay | Admitting: Orthopedic Surgery
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
## 2019-05-14 NOTE — Plan of Care (Signed)
Mother stayed past visiting hours. When asked when she was leaving she stated she stayed past hours R/T pt's pain level & first time being in hospital. Left before 2200.   Problem: Education: Goal: Knowledge of General Education information will improve Description: Including pain rating scale, medication(s)/side effects and non-pharmacologic comfort measures Outcome: Progressing   Problem: Activity: Goal: Risk for activity intolerance will decrease Outcome: Progressing   Problem: Coping: Goal: Level of anxiety will decrease Outcome: Progressing   Problem: Pain Managment: Goal: General experience of comfort will improve Outcome: Progressing   Problem: Safety: Goal: Ability to remain free from injury will improve Outcome: Progressing

## 2019-05-17 ENCOUNTER — Telehealth: Payer: Self-pay | Admitting: Orthopedic Surgery
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
## 2019-05-19 ENCOUNTER — Telehealth: Payer: Self-pay | Admitting: Family

## 2019-05-19 ENCOUNTER — Encounter: Payer: Self-pay | Admitting: Family

## 2019-05-19 ENCOUNTER — Ambulatory Visit (INDEPENDENT_AMBULATORY_CARE_PROVIDER_SITE_OTHER): Payer: Self-pay | Admitting: Family

## 2019-05-19 ENCOUNTER — Other Ambulatory Visit: Payer: Self-pay

## 2019-05-19 ENCOUNTER — Other Ambulatory Visit: Payer: Self-pay | Admitting: Orthopedic Surgery

## 2019-05-19 ENCOUNTER — Ambulatory Visit: Payer: Self-pay

## 2019-05-19 VITALS — Ht 68.0 in | Wt 120.0 lb

## 2019-05-19 DIAGNOSIS — S91332A Puncture wound without foreign body, left foot, initial encounter: Secondary | ICD-10-CM

## 2019-05-19 DIAGNOSIS — W3400XA Accidental discharge from unspecified firearms or gun, initial encounter: Secondary | ICD-10-CM

## 2019-05-19 MED ORDER — OXYCODONE HCL 10 MG PO TABS: 10.0000 mg | ORAL_TABLET | Freq: Four times a day (QID) | ORAL | 0 refills | Status: DC | PRN

## 2019-05-19 NOTE — Telephone Encounter (Signed)
Patient called stated she was here today and was told a script would be sent to pharmacy and nothing is there.  Please call patient to advise.(oxcycodone)  [phone]

## 2019-05-19 NOTE — Telephone Encounter (Signed)
Dr Sharol Given will refill today.

## 2019-05-19 NOTE — Telephone Encounter (Signed)
Please send in rx that we discussed.
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
## 2019-05-19 NOTE — Telephone Encounter (Signed)
Erin please advise, thank you.  

## 2019-05-19 NOTE — Telephone Encounter (Signed)
Patient called stating that her prescription has not been sent to the pharmacy and she is in a lot of pain.  CB#[phone].  Thank you.

## 2019-05-21 ENCOUNTER — Inpatient Hospital Stay: Payer: Self-pay | Admitting: Family

## 2019-05-24 ENCOUNTER — Telehealth: Payer: Self-pay | Admitting: Orthopedic Surgery

## 2019-05-24 ENCOUNTER — Other Ambulatory Visit: Payer: Self-pay | Admitting: Orthopedic Surgery

## 2019-05-24 MED ORDER — OXYCODONE HCL 10 MG PO TABS: 10.0000 mg | ORAL_TABLET | Freq: Two times a day (BID) | ORAL | 0 refills | Status: DC | PRN

## 2019-05-24 NOTE — Telephone Encounter (Signed)
rx sent

## 2019-05-24 NOTE — Telephone Encounter (Signed)
Pt called in requesting a refill on Oxycodone to last her until her follow up visit with dr.duda 11/19. Please have that sent to [facility] on [address].   [phone]

## 2019-05-24 NOTE — Telephone Encounter (Signed)
Rx was sent in a few minutes ago to pharmacy.

## 2019-05-24 NOTE — Telephone Encounter (Signed)
Pt called in again checking on her prescription request said she needs it filled today since she is out of pain meds.    [phone]

## 2019-05-27 ENCOUNTER — Encounter: Payer: Self-pay | Admitting: Orthopedic Surgery

## 2019-05-27 ENCOUNTER — Other Ambulatory Visit: Payer: Self-pay

## 2019-05-27 ENCOUNTER — Ambulatory Visit (INDEPENDENT_AMBULATORY_CARE_PROVIDER_SITE_OTHER): Payer: Self-pay

## 2019-05-27 ENCOUNTER — Ambulatory Visit (INDEPENDENT_AMBULATORY_CARE_PROVIDER_SITE_OTHER): Payer: Self-pay | Admitting: Orthopedic Surgery

## 2019-05-27 VITALS — Ht 68.0 in | Wt 120.0 lb

## 2019-05-27 DIAGNOSIS — S91332A Puncture wound without foreign body, left foot, initial encounter: Secondary | ICD-10-CM

## 2019-05-27 DIAGNOSIS — S92902A Unspecified fracture of left foot, initial encounter for closed fracture: Secondary | ICD-10-CM

## 2019-05-27 DIAGNOSIS — W3400XA Accidental discharge from unspecified firearms or gun, initial encounter: Secondary | ICD-10-CM

## 2019-05-31 ENCOUNTER — Telehealth: Payer: Self-pay | Admitting: Orthopedic Surgery
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
## 2019-05-31 NOTE — Telephone Encounter (Signed)
Need to see patient in office, to see what is causing pain

## 2019-05-31 NOTE — Telephone Encounter (Signed)
appt sch for tomorrow at 1pm

## 2019-05-31 NOTE — Telephone Encounter (Signed)
Rx refill Oxycodone °

## 2019-06-01 ENCOUNTER — Ambulatory Visit (INDEPENDENT_AMBULATORY_CARE_PROVIDER_SITE_OTHER): Payer: Self-pay | Admitting: Orthopedic Surgery

## 2019-06-01 ENCOUNTER — Ambulatory Visit (INDEPENDENT_AMBULATORY_CARE_PROVIDER_SITE_OTHER): Payer: Self-pay

## 2019-06-01 ENCOUNTER — Other Ambulatory Visit: Payer: Self-pay

## 2019-06-01 ENCOUNTER — Encounter: Payer: Self-pay | Admitting: Orthopedic Surgery

## 2019-06-01 VITALS — Ht 68.0 in | Wt 120.0 lb

## 2019-06-01 DIAGNOSIS — S92902A Unspecified fracture of left foot, initial encounter for closed fracture: Secondary | ICD-10-CM

## 2019-06-01 DIAGNOSIS — M79672 Pain in left foot: Secondary | ICD-10-CM

## 2019-06-01 MED ORDER — GABAPENTIN 300 MG PO CAPS: 300.0000 mg | ORAL_CAPSULE | Freq: Three times a day (TID) | ORAL | 3 refills | Status: DC
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
## 2019-06-07 ENCOUNTER — Telehealth: Payer: Self-pay | Admitting: Orthopedic Surgery

## 2019-06-07 NOTE — Telephone Encounter (Signed)
Patient called and stated that shes not sure what day to come to appt 12/3 or 12/8.  Please call patient to advise.  [phone]

## 2019-06-08 ENCOUNTER — Encounter: Payer: Self-pay | Admitting: Orthopedic Surgery

## 2019-06-08 NOTE — Telephone Encounter (Signed)
Patient was called and advised appt was scheduled for 06/15/2019.
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
## 2019-06-10 ENCOUNTER — Ambulatory Visit: Payer: Self-pay | Admitting: Orthopedic Surgery

## 2019-06-10 ENCOUNTER — Telehealth: Payer: Self-pay | Admitting: Orthopedic Surgery
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
## 2019-06-15 ENCOUNTER — Other Ambulatory Visit: Payer: Self-pay

## 2019-06-15 ENCOUNTER — Ambulatory Visit (INDEPENDENT_AMBULATORY_CARE_PROVIDER_SITE_OTHER): Payer: Self-pay | Admitting: Orthopedic Surgery

## 2019-06-15 ENCOUNTER — Ambulatory Visit (INDEPENDENT_AMBULATORY_CARE_PROVIDER_SITE_OTHER): Payer: Self-pay

## 2019-06-15 ENCOUNTER — Encounter: Payer: Self-pay | Admitting: Orthopedic Surgery

## 2019-06-15 VITALS — Ht 68.0 in | Wt 120.0 lb

## 2019-06-15 DIAGNOSIS — S92902A Unspecified fracture of left foot, initial encounter for closed fracture: Secondary | ICD-10-CM
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
## 2019-06-24 ENCOUNTER — Ambulatory Visit: Payer: Self-pay | Admitting: Physical Therapy

## 2019-06-29 ENCOUNTER — Ambulatory Visit: Payer: Self-pay | Admitting: Orthopedic Surgery

## 2019-07-21 ENCOUNTER — Encounter: Payer: Self-pay | Admitting: Physical Therapy

## 2019-07-21 ENCOUNTER — Ambulatory Visit (INDEPENDENT_AMBULATORY_CARE_PROVIDER_SITE_OTHER): Payer: Self-pay | Admitting: Physical Therapy

## 2019-07-21 ENCOUNTER — Other Ambulatory Visit: Payer: Self-pay

## 2019-07-21 DIAGNOSIS — R2689 Other abnormalities of gait and mobility: Secondary | ICD-10-CM

## 2019-07-21 DIAGNOSIS — M25672 Stiffness of left ankle, not elsewhere classified: Secondary | ICD-10-CM

## 2019-07-21 DIAGNOSIS — M25572 Pain in left ankle and joints of left foot: Secondary | ICD-10-CM

## 2019-07-21 DIAGNOSIS — M6281 Muscle weakness (generalized): Secondary | ICD-10-CM
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
## 2019-07-21 NOTE — Patient Instructions (Signed)
Access Code: [email or web address]  URL: [email or web address]  Date: 07/21/2019  Prepared by: Moshe Cipro   Exercises Seated Ankle Circles - 15 reps - 1 sets - 2x daily - 7x weekly Seated Ankle Alphabet - 1 sets - 1 A-Z - 2x daily - 7x weekly Supine Straight Leg Raises - 10 reps - 3 sets - 2x daily - 7x weekly                  Sidelying Hip Abduction - 1 sets - 10 reps - 2x daily - 7x weekly Sidelying Hip Adduction - 10 reps - 1 sets - 2x daily - 7x weekly Prone Hip Extension - 10 reps - 1 sets - 2x daily - 7x weekly

## 2019-07-22 ENCOUNTER — Ambulatory Visit: Payer: Self-pay | Admitting: Physician Assistant

## 2019-07-26 ENCOUNTER — Ambulatory Visit: Payer: Self-pay | Admitting: Physician Assistant

## 2019-07-29 ENCOUNTER — Ambulatory Visit: Payer: Self-pay | Admitting: Physician Assistant

## 2019-08-06 ENCOUNTER — Encounter: Payer: Self-pay | Admitting: Physical Therapy

## 2019-08-06 ENCOUNTER — Telehealth: Payer: Self-pay | Admitting: Physical Therapy

## 2019-08-06 NOTE — Telephone Encounter (Signed)
LVM for pt as she NS for appt.  Reminded of next appt and to call if she needed to cx.  Advised of NS policy and after 2nd NS remaining appts will be cx.  Clarita Crane, PT, DPT 08/06/19 10:41 AM

## 2019-08-10 ENCOUNTER — Encounter: Payer: Self-pay | Admitting: Physical Therapy

## 2019-08-12 ENCOUNTER — Encounter: Payer: Self-pay | Admitting: Physical Therapy

## 2019-08-17 ENCOUNTER — Encounter: Payer: Self-pay | Admitting: Physical Therapy

## 2019-08-18 ENCOUNTER — Other Ambulatory Visit: Payer: Self-pay

## 2019-08-18 ENCOUNTER — Ambulatory Visit (INDEPENDENT_AMBULATORY_CARE_PROVIDER_SITE_OTHER): Payer: Self-pay | Admitting: Physician Assistant

## 2019-08-18 ENCOUNTER — Encounter: Payer: Self-pay | Admitting: Physician Assistant

## 2019-08-18 VITALS — Ht 68.0 in | Wt 120.0 lb

## 2019-08-18 DIAGNOSIS — S92902B Unspecified fracture of left foot, initial encounter for open fracture: Secondary | ICD-10-CM
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
## 2019-08-19 ENCOUNTER — Encounter: Payer: Self-pay | Admitting: Physical Therapy

## 2019-08-24 ENCOUNTER — Encounter: Payer: Self-pay | Admitting: Physical Therapy

## 2019-08-26 ENCOUNTER — Encounter: Payer: Self-pay | Admitting: Physical Therapy

## 2019-08-31 ENCOUNTER — Encounter: Payer: Self-pay | Admitting: Physical Therapy

## 2019-09-02 ENCOUNTER — Encounter: Payer: Self-pay | Admitting: Physical Therapy

## 2019-09-16 ENCOUNTER — Ambulatory Visit (INDEPENDENT_AMBULATORY_CARE_PROVIDER_SITE_OTHER): Payer: Self-pay

## 2019-09-16 ENCOUNTER — Encounter: Payer: Self-pay | Admitting: Orthopedic Surgery

## 2019-09-16 ENCOUNTER — Ambulatory Visit (INDEPENDENT_AMBULATORY_CARE_PROVIDER_SITE_OTHER): Payer: Self-pay | Admitting: Orthopedic Surgery

## 2019-09-16 ENCOUNTER — Other Ambulatory Visit: Payer: Self-pay

## 2019-09-16 DIAGNOSIS — S92902B Unspecified fracture of left foot, initial encounter for open fracture: Secondary | ICD-10-CM

## 2019-09-16 DIAGNOSIS — W3400XA Accidental discharge from unspecified firearms or gun, initial encounter: Secondary | ICD-10-CM

## 2019-09-16 DIAGNOSIS — S91332A Puncture wound without foreign body, left foot, initial encounter: Secondary | ICD-10-CM

## 2019-09-16 DIAGNOSIS — M79672 Pain in left foot: Secondary | ICD-10-CM
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
## 2020-04-10 ENCOUNTER — Ambulatory Visit: Payer: Self-pay | Admitting: Orthopedic Surgery

## 2020-04-25 ENCOUNTER — Ambulatory Visit (INDEPENDENT_AMBULATORY_CARE_PROVIDER_SITE_OTHER): Payer: Self-pay

## 2020-04-25 ENCOUNTER — Ambulatory Visit (INDEPENDENT_AMBULATORY_CARE_PROVIDER_SITE_OTHER): Payer: Self-pay | Admitting: Orthopedic Surgery

## 2020-04-25 DIAGNOSIS — S91332S Puncture wound without foreign body, left foot, sequela: Secondary | ICD-10-CM

## 2020-04-25 DIAGNOSIS — M79672 Pain in left foot: Secondary | ICD-10-CM

## 2020-04-27 ENCOUNTER — Encounter: Payer: Self-pay | Admitting: Orthopedic Surgery
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
## 2020-05-05 ENCOUNTER — Other Ambulatory Visit: Payer: Self-pay | Admitting: Physician Assistant

## 2020-05-15 ENCOUNTER — Other Ambulatory Visit (HOSPITAL_COMMUNITY)
Admission: RE | Admit: 2020-05-15 | Discharge: 2020-05-15 | Disposition: A | Discharge status: Home / Self Care | Payer: HRSA Program | Source: Ambulatory Visit | Attending: Orthopedic Surgery | Admitting: Orthopedic Surgery

## 2020-05-15 DIAGNOSIS — Z01818 Encounter for other preprocedural examination: Secondary | ICD-10-CM | POA: Diagnosis present

## 2020-05-15 DIAGNOSIS — Z20822 Contact with and (suspected) exposure to covid-19: Secondary | ICD-10-CM | POA: Insufficient documentation

## 2020-05-15 LAB — SARS CORONAVIRUS 2 (TAT 6-24 HRS): SARS Coronavirus 2: NEGATIVE

## 2020-05-16 ENCOUNTER — Encounter (HOSPITAL_COMMUNITY): Payer: Self-pay | Admitting: Orthopedic Surgery

## 2020-05-16 NOTE — Progress Notes (Signed)
Spoke with pt for pre-op call. Pt denies cardiac history, Diabetes or HTN.  Covid test done 05/15/20 and it's negative.  Pt states she's been in quarantine since the test was done and understands that she stays in quarantine until she comes to the hospital tomorrow.

## 2020-05-17 ENCOUNTER — Other Ambulatory Visit: Payer: Self-pay

## 2020-05-17 ENCOUNTER — Ambulatory Visit (HOSPITAL_COMMUNITY): Payer: Self-pay | Admitting: Anesthesiology

## 2020-05-17 ENCOUNTER — Encounter (HOSPITAL_COMMUNITY): Payer: Self-pay | Admitting: Orthopedic Surgery

## 2020-05-17 ENCOUNTER — Encounter (HOSPITAL_COMMUNITY)
Admission: RE | Disposition: A | Discharge status: Home / Self Care | Payer: Self-pay | Source: Home / Self Care | Attending: Orthopedic Surgery

## 2020-05-17 ENCOUNTER — Ambulatory Visit (HOSPITAL_COMMUNITY)
Admission: RE | Admit: 2020-05-17 | Discharge: 2020-05-17 | Disposition: A | Discharge status: Home / Self Care | Payer: Self-pay | Attending: Orthopedic Surgery | Admitting: Orthopedic Surgery

## 2020-05-17 DIAGNOSIS — S92902K Unspecified fracture of left foot, subsequent encounter for fracture with nonunion: Secondary | ICD-10-CM

## 2020-05-17 DIAGNOSIS — M87375 Other secondary osteonecrosis, left foot: Secondary | ICD-10-CM | POA: Insufficient documentation

## 2020-05-17 DIAGNOSIS — S92902D Unspecified fracture of left foot, subsequent encounter for fracture with routine healing: Secondary | ICD-10-CM

## 2020-05-17 DIAGNOSIS — M9689 Other intraoperative and postprocedural complications and disorders of the musculoskeletal system: Secondary | ICD-10-CM | POA: Insufficient documentation

## 2020-05-17 DIAGNOSIS — Z981 Arthrodesis status: Secondary | ICD-10-CM | POA: Insufficient documentation

## 2020-05-17 DIAGNOSIS — Y838 Other surgical procedures as the cause of abnormal reaction of the patient, or of later complication, without mention of misadventure at the time of the procedure: Secondary | ICD-10-CM | POA: Insufficient documentation

## 2020-05-17 DIAGNOSIS — F17200 Nicotine dependence, unspecified, uncomplicated: Secondary | ICD-10-CM | POA: Insufficient documentation

## 2020-05-17 HISTORY — PX: FOOT ARTHRODESIS: SHX1655

## 2020-05-17 LAB — POCT PREGNANCY, URINE: Preg Test, Ur: NEGATIVE

## 2020-05-17 SURGERY — FUSION, JOINT, FOOT
Anesthesia: Regional | Site: Foot | Laterality: Left

## 2020-05-17 MED ORDER — ACETAMINOPHEN 160 MG/5ML PO SOLN: 1000.0000 mg | Freq: Once | ORAL | Status: DC | PRN

## 2020-05-17 MED ORDER — DEXAMETHASONE SODIUM PHOSPHATE 10 MG/ML IJ SOLN
INTRAMUSCULAR | Status: AC
  Filled 2020-05-17: qty 1

## 2020-05-17 MED ORDER — OXYCODONE HCL 5 MG PO TABS: 5.0000 mg | ORAL_TABLET | Freq: Once | ORAL | Status: DC | PRN

## 2020-05-17 MED ORDER — LIDOCAINE 2% (20 MG/ML) 5 ML SYRINGE
INTRAMUSCULAR | Status: AC
  Filled 2020-05-17: qty 5

## 2020-05-17 MED ORDER — BUPIVACAINE-EPINEPHRINE (PF) 0.5% -1:200000 IJ SOLN
INTRAMUSCULAR | Status: DC | PRN
  Administered 2020-05-17: 25 mL via PERINEURAL

## 2020-05-17 MED ORDER — LIDOCAINE 2% (20 MG/ML) 5 ML SYRINGE
INTRAMUSCULAR | Status: DC | PRN
  Administered 2020-05-17: 40 mg via INTRAVENOUS

## 2020-05-17 MED ORDER — PROPOFOL 10 MG/ML IV BOLUS
INTRAVENOUS | Status: AC
  Filled 2020-05-17: qty 60

## 2020-05-17 MED ORDER — ACETAMINOPHEN 10 MG/ML IV SOLN: 1000.0000 mg | Freq: Once | INTRAVENOUS | Status: DC | PRN

## 2020-05-17 MED ORDER — ACETAMINOPHEN 500 MG PO TABS: 1000.0000 mg | ORAL_TABLET | Freq: Once | ORAL | Status: DC | PRN

## 2020-05-17 MED ORDER — DEXAMETHASONE SODIUM PHOSPHATE 10 MG/ML IJ SOLN
INTRAMUSCULAR | Status: DC | PRN
  Administered 2020-05-17: 5 mg via INTRAVENOUS

## 2020-05-17 MED ORDER — CHLORHEXIDINE GLUCONATE 0.12 % MT SOLN
15.0000 mL | Freq: Once | OROMUCOSAL | Status: AC
  Administered 2020-05-17: 15 mL via OROMUCOSAL
  Filled 2020-05-17: qty 15

## 2020-05-17 MED ORDER — FENTANYL CITRATE (PF) 250 MCG/5ML IJ SOLN
INTRAMUSCULAR | Status: AC
  Filled 2020-05-17: qty 5

## 2020-05-17 MED ORDER — FENTANYL CITRATE (PF) 100 MCG/2ML IJ SOLN
25.0000 ug | INTRAMUSCULAR | Status: DC | PRN
  Administered 2020-05-17: 25 ug via INTRAVENOUS

## 2020-05-17 MED ORDER — CEFAZOLIN SODIUM-DEXTROSE 2-4 GM/100ML-% IV SOLN
2.0000 g | INTRAVENOUS | Status: AC
  Administered 2020-05-17: 2 g via INTRAVENOUS
  Filled 2020-05-17: qty 100

## 2020-05-17 MED ORDER — FENTANYL CITRATE (PF) 100 MCG/2ML IJ SOLN
INTRAMUSCULAR | Status: AC
  Filled 2020-05-17: qty 2

## 2020-05-17 MED ORDER — OXYCODONE-ACETAMINOPHEN 5-325 MG PO TABS: 1.0000 | ORAL_TABLET | ORAL | 0 refills | Status: DC | PRN

## 2020-05-17 MED ORDER — ORAL CARE MOUTH RINSE: 15.0000 mL | Freq: Once | OROMUCOSAL | Status: AC

## 2020-05-17 MED ORDER — SODIUM CHLORIDE 0.9 % IR SOLN
Status: DC | PRN
  Administered 2020-05-17: 1000 mL

## 2020-05-17 MED ORDER — FENTANYL CITRATE (PF) 250 MCG/5ML IJ SOLN
INTRAMUSCULAR | Status: DC | PRN
  Administered 2020-05-17 (×4): 25 ug via INTRAVENOUS
  Administered 2020-05-17: 50 ug via INTRAVENOUS
  Administered 2020-05-17: 25 ug via INTRAVENOUS

## 2020-05-17 MED ORDER — DEXMEDETOMIDINE (PRECEDEX) IN NS 20 MCG/5ML (4 MCG/ML) IV SYRINGE
PREFILLED_SYRINGE | INTRAVENOUS | Status: DC | PRN
  Administered 2020-05-17: 8 ug via INTRAVENOUS

## 2020-05-17 MED ORDER — ONDANSETRON HCL 4 MG/2ML IJ SOLN
INTRAMUSCULAR | Status: DC | PRN
  Administered 2020-05-17: 4 mg via INTRAVENOUS

## 2020-05-17 MED ORDER — ONDANSETRON HCL 4 MG/2ML IJ SOLN
INTRAMUSCULAR | Status: AC
  Filled 2020-05-17: qty 2

## 2020-05-17 MED ORDER — MIDAZOLAM HCL 2 MG/2ML IJ SOLN
INTRAMUSCULAR | Status: DC | PRN
  Administered 2020-05-17: 2 mg via INTRAVENOUS

## 2020-05-17 MED ORDER — MIDAZOLAM HCL 2 MG/2ML IJ SOLN
INTRAMUSCULAR | Status: AC
  Filled 2020-05-17: qty 2

## 2020-05-17 MED ORDER — PROPOFOL 10 MG/ML IV BOLUS
INTRAVENOUS | Status: DC | PRN
  Administered 2020-05-17: 20 mg via INTRAVENOUS
  Administered 2020-05-17: 130 mg via INTRAVENOUS

## 2020-05-17 MED ORDER — LACTATED RINGERS IV SOLN: INTRAVENOUS | Status: DC

## 2020-05-17 MED ORDER — OXYCODONE HCL 5 MG/5ML PO SOLN: 5.0000 mg | Freq: Once | ORAL | Status: DC | PRN

## 2020-05-17 SURGICAL SUPPLY — 36 items
BIT DRILL CANN 3/16  4.6X250 (DRILL) IMPLANT
BLADE SAW SGTL NAR THIN XSHT (BLADE) ×4 IMPLANT
BNDG COHESIVE 6X5 TAN NS LF (GAUZE/BANDAGES/DRESSINGS) ×2 IMPLANT
BNDG GAUZE ELAST 4 BULKY (GAUZE/BANDAGES/DRESSINGS) ×2 IMPLANT
COVER SURGICAL LIGHT HANDLE (MISCELLANEOUS) ×4 IMPLANT
DRAPE INCISE IOBAN 66X45 STRL (DRAPES) ×3 IMPLANT
DRAPE OEC MINIVIEW 54X84 (DRAPES) IMPLANT
DRAPE U-SHAPE 47X51 STRL (DRAPES) ×3 IMPLANT
DRILL CANN 3/16 STD 4.6X250 (DRILL) ×3
DRSG EMULSION OIL 3X3 NADH (GAUZE/BANDAGES/DRESSINGS) ×2 IMPLANT
DURAPREP 26ML APPLICATOR (WOUND CARE) ×3 IMPLANT
ELECT REM PT RETURN 9FT ADLT (ELECTROSURGICAL) ×3
ELECTRODE REM PT RTRN 9FT ADLT (ELECTROSURGICAL) ×1 IMPLANT
GAUZE SPONGE 4X4 12PLY STRL (GAUZE/BANDAGES/DRESSINGS) ×2 IMPLANT
GLOVE BIOGEL PI IND STRL 9 (GLOVE) ×1 IMPLANT
GLOVE BIOGEL PI INDICATOR 9 (GLOVE) ×2
GLOVE SURG ORTHO 9.0 STRL STRW (GLOVE) ×3 IMPLANT
GOWN STRL REUS W/ TWL XL LVL3 (GOWN DISPOSABLE) ×3 IMPLANT
GOWN STRL REUS W/TWL XL LVL3 (GOWN DISPOSABLE) ×9
K-WIRE SINGLE SMOOTH 2.3X300 (WIRE) ×6
KIT BASIN OR (CUSTOM PROCEDURE TRAY) ×3 IMPLANT
KIT STAPLE ARCUS 20X17 STRL (Staple) ×2 IMPLANT
KIT TURNOVER KIT B (KITS) ×3 IMPLANT
KWIRE SINGLE SMOOTH 2.3X300 (WIRE) IMPLANT
MANIFOLD NEPTUNE II (INSTRUMENTS) ×3 IMPLANT
NS IRRIG 1000ML POUR BTL (IV SOLUTION) ×3 IMPLANT
PACK ORTHO EXTREMITY (CUSTOM PROCEDURE TRAY) ×3 IMPLANT
PAD ABD 7.5X8 STRL (GAUZE/BANDAGES/DRESSINGS) ×2 IMPLANT
PUTTY DBM STAGRAFT PLUS 5CC (Putty) ×2 IMPLANT
SCREW CANN HDLS 7.2X125 (Screw) ×2 IMPLANT
SUCTION FRAZIER HANDLE 10FR (MISCELLANEOUS) ×3
SUCTION TUBE FRAZIER 10FR DISP (MISCELLANEOUS) ×1 IMPLANT
TOWEL GREEN STERILE (TOWEL DISPOSABLE) ×3 IMPLANT
TOWEL GREEN STERILE FF (TOWEL DISPOSABLE) ×3 IMPLANT
TUBE CONNECTING 12'X1/4 (SUCTIONS) ×1
TUBE CONNECTING 12X1/4 (SUCTIONS) ×2 IMPLANT

## 2020-05-17 NOTE — Op Note (Signed)
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 C-arm fluoroscopy to verify reduction and fusion.  SURGEON:  Nadara Mustard, MD  PHYSICIAN ASSISTANT:None ANESTHESIA:   General  PREOPERATIVE INDICATIONS:  Erika Benton is a  24 y.o. 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    The risks benefits and alternatives were discussed with the patient preoperatively including but not limited to the risks of infection, bleeding, nerve injury, cardiopulmonary complications, the need for revision surgery, among others, and the patient was willing to proceed.  OPERATIVE IMPLANTS: Solid been to fuse the medial column left foot. Staple to fuse the calcaneocuboid joint. Demineralized bone matrix at the fusion sites. C arm fluoroscopy.  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  C-arm fluoroscopy verified reduction in both AP and lateral planes.  Monday night  OPERATIVE PROCEDURE: Patient was brought the operating room after undergoing a regional anesthetic she then underwent a general anesthetic.  After adequate levels anesthesia were obtained patient's left lower extremity was prepped using DuraPrep draped into a sterile field a timeout was called.  A dorsal incision was made over the previous dorsal incision over the medial column.  This is carried down through the interval between the anterior tibial tendon and the EHL.  There is a significant amount of fibrous tissue this was removed the plate was removed there was multiple broken screws and screws that were  accessed and not protruding from the bone were removed.  The fusion sites had a fibrous union and an oscillating saw was used to remove the fibrous union as well as a curette from the previous fusion sites.  There was also instability at the base of the first metatarsal medial cuneiform and the articular cartilage was removed from the site.  A small incision was made plantar to the head of the first metatarsal guidewire was inserted from the metatarsal head up into the talus.  C-arm fluoroscopy verified alignment in AP and lateral planes this was overdrilled and a solid 120 mm compression screw was placed this pulled the fractures nonunion together and this was packed with bone graft.  The wound was irrigated and the incision was closed using 2-0 nylon.  A separate incision was made laterally over the previous cuboid calcaneal fusion.  The staple was removed it was loose the fibrous union was removed with a oscillating saw and curette and rondure.  This was packed with bone graft and a compression staple was applied C-arm fluoroscopy verified alignment the wound was irrigated incisions closed in 2 months.  Patient was extubated from the PACU in stable condition.   DISCHARGE PLANNING:  Antibiotic duration: Preoperative antibiotics  Weightbearing: Nonweightbearing on the left  Pain medication: Prescription for Percocet  Dressing care/ Wound VAC: Dry dressing  Ambulatory devices: Crutches  Discharge to: Home.  Follow-up: In the office 1 week post operative.

## 2020-05-17 NOTE — Progress Notes (Signed)
Orthopedic Tech Progress Note Patient Details:  Erika Benton Feb 02, 1996 [id number] PACU RN called requesting a PAIR OF CRUTCHES nad a CAM WALKER. Patient refused to allow me to put on CAM [name] I left it on the bedside table.  Ortho Devices Type of Ortho Device: Crutches, CAM walker Ortho Device/Splint Location: LLE Ortho Device/Splint Interventions: Ordered, Other (comment)   Post Interventions Patient Tolerated: Other (comment) Instructions Provided: Other (comment)   Donald Pore 05/17/2020, 11:50 AM
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
## 2020-05-17 NOTE — Transfer of Care (Signed)
Immediate Anesthesia Transfer of Care Note  Patient: Erika Benton  Procedure(s) Performed: LEFT FOOT REVISION FUSION (Left Foot)  Patient Location: PACU  Anesthesia Type:GA combined with regional for post-op pain  Level of Consciousness: awake, alert , drowsy and patient cooperative  Airway & Oxygen Therapy: Patient Spontanous Breathing and Patient connected to face mask oxygen  Post-op Assessment: Report given to RN, Post -op Vital signs reviewed and stable and Patient moving all extremities X 4  Post vital signs: Reviewed and stable  Last Vitals:  Vitals Value Taken Time  BP 148/80 05/17/20 1010  Temp    Pulse 109 05/17/20 1011  Resp 17 05/17/20 1011  SpO2 100 % 05/17/20 1011  Vitals shown include unvalidated device data.  Last Pain:  Vitals:   05/17/20 7824  TempSrc: Oral  PainSc:          Complications: No complications documented.

## 2020-05-17 NOTE — Anesthesia Procedure Notes (Addendum)
Procedure Name: MAC Date/Time: 05/17/2020 8:37 AM Performed by: Rande Brunt, CRNA Pre-anesthesia Checklist: Patient identified, Emergency Drugs available, Suction available and Patient being monitored Patient Re-evaluated:Patient Re-evaluated prior to induction Oxygen Delivery Method: Circle system utilized Preoxygenation: Pre-oxygenation with 100% oxygen Induction Type: IV induction LMA: LMA inserted LMA Size: 3.0 Tube secured with: Tape Dental Injury: Teeth and Oropharynx as per pre-operative assessment

## 2020-05-17 NOTE — Anesthesia Preprocedure Evaluation (Signed)
Anesthesia Evaluation  Patient identified by MRN, date of birth, ID band Patient awake    Reviewed: Allergy & Precautions, NPO status , Patient's Chart, lab work & pertinent test results  History of Anesthesia Complications Negative for: history of anesthetic complications  Airway Mallampati: I  TM Distance: >3 FB Neck ROM: Full    Dental  (+) Dental Advisory Given, Teeth Intact   Pulmonary asthma , Current Smoker and Patient abstained from smoking.,  Covid-19 Nucleic Acid Test Results Lab Results      Component                Value               Date                      SARSCOV2NAA              NEGATIVE            05/15/2020                SARSCOV2NAA              NEGATIVE            05/12/2019              breath sounds clear to auscultation       Cardiovascular negative cardio ROS   Rhythm:Regular     Neuro/Psych negative neurological ROS  negative psych ROS   GI/Hepatic negative GI ROS, Neg liver ROS,   Endo/Other  negative endocrine ROS  Renal/GU negative Renal ROS     Musculoskeletal 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   Abdominal   Peds  Hematology negative hematology ROS (+)   Anesthesia Other Findings   Reproductive/Obstetrics Lab Results      Component                Value               Date                      PREGTESTUR               NEGATIVE            05/17/2020                HCG                      <5.0                05/08/2019                                        Anesthesia Physical Anesthesia Plan  ASA: II  Anesthesia Plan: General   Post-op Pain Management:  Regional for Post-op pain   Induction: Intravenous  PONV Risk Score and Plan: 2 and Ondansetron and Dexamethasone  Airway Management Planned: LMA  Additional Equipment: None  Intra-op Plan:   Post-operative Plan: Extubation in OR  Informed Consent: I have reviewed the  patients History and Physical, chart, labs and discussed the procedure including the risks, benefits and alternatives for the proposed anesthesia with the patient or authorized representative who has indicated his/her understanding and acceptance.     Dental advisory given  Plan Discussed with: CRNA and Surgeon  Anesthesia Plan Comments:         Anesthesia Quick Evaluation

## 2020-05-17 NOTE — Anesthesia Postprocedure Evaluation (Signed)
Anesthesia Post Note  Patient: Erika Benton  Procedure(s) Performed: LEFT FOOT REVISION FUSION (Left Foot)     Anesthesia Post Evaluation No complications documented.  Last Vitals:  Vitals:   05/17/20 1045 05/17/20 1054  BP: 107/64 106/72  Pulse: 65 73  Resp: 16 12  Temp: 36.4 C   SpO2: 99% 100%    Last Pain:  Vitals:   05/17/20 1045  TempSrc:   PainSc: 6                  Lynwood Kubisiak

## 2020-05-18 ENCOUNTER — Other Ambulatory Visit: Payer: Self-pay | Admitting: Physician Assistant

## 2020-05-18 ENCOUNTER — Telehealth: Payer: Self-pay | Admitting: Orthopedic Surgery

## 2020-05-18 MED ORDER — KETOROLAC TROMETHAMINE 10 MG PO TABS: 10.0000 mg | ORAL_TABLET | Freq: Four times a day (QID) | ORAL | 0 refills | Status: AC | PRN

## 2020-05-18 NOTE — Telephone Encounter (Signed)
Pt called stating she had surgery and after she was released her pain levels elevated and she wasn't able to sleep and ended up going back to the emergency room; pt would like a CB in regards to what she can do to stop the pain   [phone]

## 2020-05-18 NOTE — Telephone Encounter (Signed)
Pt has L foot surgery yesterday and was sent home after surgery after being told she would be overnight stay, pt is having excruciating pain feels like meds not working was given oxycodone. Pt stays she was on way to hospital at 3am but never went in. Pt would like a call back to see what other options she can get.   CB [phone]

## 2020-05-18 NOTE — Telephone Encounter (Signed)
I spoke with the patient by phone today.  I discussed with her the importance of elevating her foot above heart level.  Also discussed with her that I will call in Toradol for her.  She may increase her Percocet to 2 every 4 hours as needed.  She will contact us if she does not get improvement.  All of her questions were answered

## 2020-05-18 NOTE — Telephone Encounter (Signed)
Pt is s/p revision foot surgery yesterday please see message below and advise.

## 2020-05-19 ENCOUNTER — Encounter (HOSPITAL_COMMUNITY): Payer: Self-pay | Admitting: Orthopedic Surgery

## 2020-05-24 ENCOUNTER — Ambulatory Visit (INDEPENDENT_AMBULATORY_CARE_PROVIDER_SITE_OTHER): Payer: Self-pay | Admitting: Physician Assistant

## 2020-05-24 ENCOUNTER — Encounter: Payer: Self-pay | Admitting: Physician Assistant

## 2020-05-24 VITALS — Ht 68.0 in | Wt 115.0 lb

## 2020-05-24 DIAGNOSIS — S92902B Unspecified fracture of left foot, initial encounter for open fracture: Secondary | ICD-10-CM

## 2020-05-24 MED ORDER — OXYCODONE-ACETAMINOPHEN 5-325 MG PO TABS: 1.0000 | ORAL_TABLET | ORAL | 0 refills | Status: DC | PRN

## 2020-05-24 MED ORDER — METHOCARBAMOL 500 MG PO TABS: 500.0000 mg | ORAL_TABLET | Freq: Four times a day (QID) | ORAL | 0 refills | Status: AC | PRN

## 2020-05-24 NOTE — Progress Notes (Signed)
Office Visit Note   Patient: Erika Benton           Date of Birth: 11-24-1995           MRN: [id number] Visit Date: 05/24/2020              Requested by: No referring provider defined for this encounter. PCP: Patient, No Pcp Per  Chief Complaint  Patient presents with  . Left Foot - Routine Post Op    05/17/20 left foot revision fusion       HPI: Patient is 1 week status post left foot revision fusion.  She is nonweightbearing.  She cannot tolerate the cam walker boot because of the incision under her first MTP.  She states it is quite sensitive.  She denies fever, chills, or calf pain.  She does get some cramping in the arch of her foot.  She denies any calf cramping  Assessment & Plan: Visit Diagnoses: No diagnosis found.  Plan: Have refilled her oxycodone.  Also have written her for some Robaxin for muscle spasm.  She will follow-up in 1 week at which time x-rays of her foot should be taken she was instructed to continue to elevate she may cleanse the foot with soap and water  Follow-Up Instructions: No follow-ups on file.   Ortho Exam  Patient is alert, oriented, no adenopathy, well-dressed, normal affect, normal respiratory effort. Incisions are well opposed and healing.  Moderate amount of soft tissue swelling no cellulitis she is extremely sensitive even to light touch over the wound on the bottom of her foot.  Pulses are palpable.  Compartments soft nontender  Imaging: No results found. No images are attached to the encounter.  Labs: No results found for: HGBA1C, ESRSEDRATE, CRP, LABURIC, REPTSTATUS, GRAMSTAIN, CULT, LABORGA   No results found for: ALBUMIN, PREALBUMIN, LABURIC  No results found for: MG No results found for: VD25OH  No results found for: PREALBUMIN CBC EXTENDED Latest Ref Rng & Units 05/08/2019  WBC 4.0 - 10.5 K/uL 6.9  RBC 3.87 - 5.11 MIL/uL 4.46  HGB 12.0 - 15.0 g/dL 15.5(H)  HCT 36 - 46 % 43.8  PLT 150 - 400 K/uL 144(L)    NEUTROABS 1.7 - 7.7 K/uL 3.6  LYMPHSABS 0.7 - 4.0 K/uL 4.8(H)     Body mass index is 17.49 kg/m.  Orders:  No orders of the defined types were placed in this encounter.  Meds ordered this encounter  Medications  . oxyCODONE-acetaminophen (PERCOCET) 5-325 MG tablet    Sig: Take 1 tablet by mouth every 4 (four) hours as needed.    Dispense:  30 tablet    Refill:  0  . methocarbamol (ROBAXIN) 500 MG tablet    Sig: Take 1 tablet (500 mg total) by mouth every 6 (six) hours as needed for muscle spasms.    Dispense:  30 tablet    Refill:  0     Procedures: No procedures performed  Clinical Data: No additional findings.  ROS:  All other systems negative, except as noted in the HPI. Review of Systems  Objective: Vital Signs: Ht 5\' 8"  (1.727 m)   Wt 115 lb (52.2 kg)   BMI 17.49 kg/m   Specialty Comments:  No specialty comments available.  PMFS History: Patient Active Problem List   Diagnosis Date Noted  . Foot fracture, left 05/13/2019  . Foot fracture, left, open, initial encounter    Past Medical History:  Diagnosis Date  . Asthma  05/11/2019 - "hasn't flarred up in a while"    No family history on file.  Past Surgical History:  Procedure Laterality Date  . APPLICATION OF WOUND VAC Left 05/12/2019   Procedure: Application Of Wound Vac;  Surgeon: Nadara Mustard, MD;  Location: [facility] OR;  Service: Orthopedics;  Laterality: Left;  . FOOT ARTHRODESIS Left 05/17/2020   Procedure: LEFT FOOT REVISION FUSION;  Surgeon: Nadara Mustard, MD;  Location: [facility] OR;  Service: Orthopedics;  Laterality: Left;  . ORIF ANKLE FRACTURE Left 05/12/2019   Procedure: OPEN REDUCTION INTERNAL FIXATION (ORIF) LEFT FOOT;  Surgeon: Nadara Mustard, MD;  Location: [facility] OR;  Service: Orthopedics;  Laterality: Left;   Social History   Occupational History  . Not on file  Tobacco Use  . Smoking status: Current Every Day Smoker    Packs/day: 0.25    Years: 4.00    Pack years: 1.00  . Smokeless  tobacco: Never Used  Vaping Use  . Vaping Use: Never used  Substance and Sexual Activity  . Alcohol use: Not Currently    Alcohol/week: 8.0 standard drinks    Types: 8 Cans of beer per week  . Drug use: Never  . Sexual activity: Not on file

## 2020-05-29 ENCOUNTER — Telehealth: Payer: Self-pay | Admitting: Orthopedic Surgery

## 2020-05-29 NOTE — Telephone Encounter (Signed)
IC,lmvm for pt to [facility]. I have questions regarding the release form she filled out.

## 2020-05-31 ENCOUNTER — Ambulatory Visit (INDEPENDENT_AMBULATORY_CARE_PROVIDER_SITE_OTHER): Payer: Self-pay

## 2020-05-31 ENCOUNTER — Ambulatory Visit (INDEPENDENT_AMBULATORY_CARE_PROVIDER_SITE_OTHER): Payer: Self-pay | Admitting: Family

## 2020-05-31 ENCOUNTER — Encounter: Payer: Self-pay | Admitting: Family

## 2020-05-31 DIAGNOSIS — M79672 Pain in left foot: Secondary | ICD-10-CM

## 2020-05-31 NOTE — Progress Notes (Signed)
   Post-Op Visit Note   Patient: Erika Benton           Date of Birth: 08/16/1995           MRN: [id number] Visit Date: 05/31/2020 PCP: Patient, No Pcp Per  Chief Complaint: No chief complaint on file.   HPI:  HPI The patient is a 24 year old woman who presents today 2 weeks status post revision fusion of the left foot. She continues nonweightbearing using crutches.  Ortho Exam On examination the dorsal left foot incision is well-healed as well as the plantar incision beneath the first metatarsal head she also has an incision that is well approximated healing well over the lateral column of her foot sutures are in place and all there is no drainage erythema or sign of infection  Visit Diagnoses:  1. Left foot pain     Plan: We will harvest sutures today continue with daily Dial soap cleansing dry dressing changes.  Continue nonweightbearing with crutches may advance weightbearing in her cam walker when is able to do so pain-free plan to have her bring the cam walker to her next appointment she does not have a today  Follow-Up Instructions: No follow-ups on file.   Imaging: No results found.  Orders:  Orders Placed This Encounter  Procedures  . XR Foot Complete Left   No orders of the defined types were placed in this encounter.    PMFS History: Patient Active Problem List   Diagnosis Date Noted  . Foot fracture, left 05/13/2019  . Foot fracture, left, open, initial encounter    Past Medical History:  Diagnosis Date  . Asthma    05/11/2019 - "hasn't flarred up in a while"    No family history on file.  Past Surgical History:  Procedure Laterality Date  . APPLICATION OF WOUND VAC Left 05/12/2019   Procedure: Application Of Wound Vac;  Surgeon: Nadara Mustard, MD;  Location: [facility] OR;  Service: Orthopedics;  Laterality: Left;  . FOOT ARTHRODESIS Left 05/17/2020   Procedure: LEFT FOOT REVISION FUSION;  Surgeon: Nadara Mustard, MD;  Location: [facility] OR;  Service:  Orthopedics;  Laterality: Left;  . ORIF ANKLE FRACTURE Left 05/12/2019   Procedure: OPEN REDUCTION INTERNAL FIXATION (ORIF) LEFT FOOT;  Surgeon: Nadara Mustard, MD;  Location: [facility] OR;  Service: Orthopedics;  Laterality: Left;   Social History   Occupational History  . Not on file  Tobacco Use  . Smoking status: Current Every Day Smoker    Packs/day: 0.25    Years: 4.00    Pack years: 1.00  . Smokeless tobacco: Never Used  Vaping Use  . Vaping Use: Never used  Substance and Sexual Activity  . Alcohol use: Not Currently    Alcohol/week: 8.0 standard drinks    Types: 8 Cans of beer per week  . Drug use: Never  . Sexual activity: Not on file

## 2020-06-07 ENCOUNTER — Ambulatory Visit: Payer: Self-pay | Admitting: Family

## 2020-06-09 ENCOUNTER — Ambulatory Visit: Payer: Self-pay | Admitting: Family

## 2020-06-16 ENCOUNTER — Ambulatory Visit (INDEPENDENT_AMBULATORY_CARE_PROVIDER_SITE_OTHER): Payer: Self-pay | Admitting: Family

## 2020-06-16 ENCOUNTER — Telehealth: Payer: Self-pay | Admitting: Orthopedic Surgery

## 2020-06-16 ENCOUNTER — Encounter: Payer: Self-pay | Admitting: Family

## 2020-06-16 ENCOUNTER — Ambulatory Visit: Payer: Self-pay

## 2020-06-16 DIAGNOSIS — M79672 Pain in left foot: Secondary | ICD-10-CM

## 2020-06-16 NOTE — Telephone Encounter (Signed)
Pt called wanting to know what questions Tammy had in regards to her paperwork; she would like a CB   [phone]

## 2020-06-16 NOTE — Progress Notes (Signed)
   Post-Op Visit Note   Patient: Erika Benton           Date of Birth: [birth year]           MRN: [id number] Visit Date: 06/16/2020 PCP: Patient, No Pcp Per  Chief Complaint: No chief complaint on file.   HPI:  HPI The patient is a 24 year old woman seen status post revision fusion left foot. Has been TDWTB with cam walker and crutches.  Ortho Exam Incisions are well healed. Minimal edema to foot. No erythema. No warmth.   Pain beneath the first metatarsal head, especially with weightbearing.   Visit Diagnoses:  1. Left foot pain     Plan: continue CAM. May advance weight bearing as tolerates in CAM. Nope to advance to full weight bearing in 3 weeks.   Follow-Up Instructions: No follow-ups on file.   Imaging: No results found.  Orders:  No orders of the defined types were placed in this encounter.  No orders of the defined types were placed in this encounter.    PMFS History: Patient Active Problem List   Diagnosis Date Noted  . Foot fracture, left 05/13/2019  . Foot fracture, left, open, initial encounter    Past Medical History:  Diagnosis Date  . Asthma    05/11/2019 - "hasn't flarred up in a while"    History reviewed. No pertinent family history.  Past Surgical History:  Procedure Laterality Date  . APPLICATION OF WOUND VAC Left 05/12/2019   Procedure: Application Of Wound Vac;  Surgeon: Nadara Mustard, MD;  Location: [facility] OR;  Service: Orthopedics;  Laterality: Left;  . FOOT ARTHRODESIS Left 05/17/2020   Procedure: LEFT FOOT REVISION FUSION;  Surgeon: Nadara Mustard, MD;  Location: [facility] OR;  Service: Orthopedics;  Laterality: Left;  . ORIF ANKLE FRACTURE Left 05/12/2019   Procedure: OPEN REDUCTION INTERNAL FIXATION (ORIF) LEFT FOOT;  Surgeon: Nadara Mustard, MD;  Location: [facility] OR;  Service: Orthopedics;  Laterality: Left;   Social History   Occupational History  . Not on file  Tobacco Use  . Smoking status: Current Every Day Smoker    Packs/day:  0.25    Years: 4.00    Pack years: 1.00  . Smokeless tobacco: Never Used  Vaping Use  . Vaping Use: Never used  Substance and Sexual Activity  . Alcohol use: Not Currently    Alcohol/week: 8.0 standard drinks    Types: 8 Cans of beer per week  . Drug use: Never  . Sexual activity: Not on file

## 2020-07-06 ENCOUNTER — Ambulatory Visit: Payer: Self-pay | Admitting: Physician Assistant

## 2020-08-14 ENCOUNTER — Ambulatory Visit (INDEPENDENT_AMBULATORY_CARE_PROVIDER_SITE_OTHER): Payer: Self-pay | Admitting: Orthopedic Surgery

## 2020-08-14 ENCOUNTER — Encounter: Payer: Self-pay | Admitting: Physician Assistant

## 2020-08-14 ENCOUNTER — Ambulatory Visit (INDEPENDENT_AMBULATORY_CARE_PROVIDER_SITE_OTHER): Payer: Self-pay

## 2020-08-14 DIAGNOSIS — S91332S Puncture wound without foreign body, left foot, sequela: Secondary | ICD-10-CM

## 2020-08-14 DIAGNOSIS — M79672 Pain in left foot: Secondary | ICD-10-CM
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
## 2020-08-29 ENCOUNTER — Ambulatory Visit: Payer: Self-pay | Admitting: Orthopedic Surgery

## 2020-08-30 ENCOUNTER — Encounter: Payer: Self-pay | Admitting: Physician Assistant

## 2020-08-30 ENCOUNTER — Ambulatory Visit (INDEPENDENT_AMBULATORY_CARE_PROVIDER_SITE_OTHER): Payer: Self-pay

## 2020-08-30 ENCOUNTER — Telehealth: Payer: Self-pay | Admitting: Physician Assistant

## 2020-08-30 ENCOUNTER — Ambulatory Visit (INDEPENDENT_AMBULATORY_CARE_PROVIDER_SITE_OTHER): Payer: Self-pay | Admitting: Physician Assistant

## 2020-08-30 DIAGNOSIS — S92902A Unspecified fracture of left foot, initial encounter for closed fracture: Secondary | ICD-10-CM

## 2020-08-30 DIAGNOSIS — M79672 Pain in left foot: Secondary | ICD-10-CM

## 2020-08-30 NOTE — Telephone Encounter (Signed)
Patient called advised her pain medicine was not called into the pharmacy and she need it today. The number to contact patient is [phone]

## 2020-08-30 NOTE — Progress Notes (Signed)
Office Visit Note   Patient: Erika Benton           Date of Birth: Aug 21, 1995           MRN: [id number] Visit Date: 08/30/2020              Requested by: No referring provider defined for this encounter. PCP: Patient, No Pcp Per  Chief Complaint  Patient presents with  . Left Foot - Follow-up    05/17/20 left foot revision fusion       HPI:  Who presents 3 months status post left foot revision fusion.  She states that she was doing some stretching yesterday as she has been instructed.  Overnight she had significant increase in swelling and pain in the dorsum lateral side of her foot and also some pain medially.  She cannot place weight on it.  She denies any other trauma Assessment & Plan: Visit Diagnoses:  1. Pain in left foot   2. Closed fracture of left foot, initial encounter     Plan: We will order a CT scan should follow-up with Dr. Lajoyce Corners once this is completed I would like her to go back in her boot to hold her foot up and to remain nonweightbearing  Follow-Up Instructions: Return in about 4 weeks (around 09/27/2020).   Ortho Exam  Patient is alert, oriented, no adenopathy, well-dressed, normal affect, normal respiratory examination demonstrates no cellulitis well-healed surgical incision moderate soft tissue swelling and sensitivity over the dorsal lateral aspect of the foot also over the medial side of the foot.  She can weakly flex her foot but this causes her pain.  Pulses are intact.  No sign of infection.  Imaging: No results found. No images are attached to the encounter.  Labs: No results found for: HGBA1C, ESRSEDRATE, CRP, LABURIC, REPTSTATUS, GRAMSTAIN, CULT, LABORGA   No results found for: ALBUMIN, PREALBUMIN, LABURIC  No results found for: MG No results found for: VD25OH  No results found for: PREALBUMIN CBC EXTENDED Latest Ref Rng & Units 05/08/2019  WBC 4.0 - 10.5 K/uL 6.9  RBC 3.87 - 5.11 MIL/uL 4.46  HGB 12.0 - 15.0 g/dL 15.5(H)   HCT 36.0 - 46.0 % 43.8  PLT 150 - 400 K/uL 144(L)  NEUTROABS 1.7 - 7.7 K/uL 3.6  LYMPHSABS 0.7 - 4.0 K/uL 4.8(H)     There is no height or weight on file to calculate BMI.  Orders:  Orders Placed This Encounter  Procedures  . XR Foot Complete Left  . CT FOOT LEFT W WO CONTRAST   No orders of the defined types were placed in this encounter.    Procedures: No procedures performed  Clinical Data: No additional findings.  ROS:  All other systems negative, except as noted in the HPI. Review of Systems  Objective: Vital Signs: There were no vitals taken for this visit.  Specialty Comments:  No specialty comments available.  PMFS History: Patient Active Problem List   Diagnosis Date Noted  . Foot fracture, left 05/13/2019  . Foot fracture, left, open, initial encounter    Past Medical History:  Diagnosis Date  . Asthma    05/11/2019 - "hasn't flarred up in a while"    History reviewed. No pertinent family history.  Past Surgical History:  Procedure Laterality Date  . APPLICATION OF WOUND VAC Left 05/12/2019   Procedure: Application Of Wound Vac;  Surgeon: Nadara Mustard, MD;  Location: [facility] OR;  Service: Orthopedics;  Laterality: Left;  .  FOOT ARTHRODESIS Left 05/17/2020   Procedure: LEFT FOOT REVISION FUSION;  Surgeon: Nadara Mustard, MD;  Location: [facility] OR;  Service: Orthopedics;  Laterality: Left;  . ORIF ANKLE FRACTURE Left 05/12/2019   Procedure: OPEN REDUCTION INTERNAL FIXATION (ORIF) LEFT FOOT;  Surgeon: Nadara Mustard, MD;  Location: [facility] OR;  Service: Orthopedics;  Laterality: Left;   Social History   Occupational History  . Not on file  Tobacco Use  . Smoking status: Current Every Day Smoker    Packs/day: 0.25    Years: 4.00    Pack years: 1.00  . Smokeless tobacco: Never Used  Vaping Use  . Vaping Use: Never used  Substance and Sexual Activity  . Alcohol use: Not Currently    Alcohol/week: 8.0 standard drinks    Types: 8 Cans of beer per week  .  Drug use: Never  . Sexual activity: Not on file

## 2020-08-31 ENCOUNTER — Other Ambulatory Visit: Payer: Self-pay | Admitting: Physician Assistant

## 2020-08-31 ENCOUNTER — Ambulatory Visit
Admission: RE | Admit: 2020-08-31 | Discharge: 2020-08-31 | Disposition: A | Discharge status: Home / Self Care | Payer: Self-pay | Source: Ambulatory Visit | Attending: Physician Assistant | Admitting: Physician Assistant

## 2020-08-31 ENCOUNTER — Other Ambulatory Visit: Payer: Self-pay

## 2020-08-31 DIAGNOSIS — M79672 Pain in left foot: Secondary | ICD-10-CM

## 2020-08-31 DIAGNOSIS — S92902A Unspecified fracture of left foot, initial encounter for closed fracture: Secondary | ICD-10-CM

## 2020-08-31 MED ORDER — OXYCODONE-ACETAMINOPHEN 5-325 MG PO TABS: 1.0000 | ORAL_TABLET | ORAL | 0 refills | Status: AC | PRN

## 2020-08-31 NOTE — Telephone Encounter (Signed)
This pt is pending a stat CT was in the office yesterday. Please see below.

## 2020-08-31 NOTE — Telephone Encounter (Signed)
Rx done

## 2020-08-31 NOTE — Telephone Encounter (Signed)
Called and lm on vm to advise that rx has been sent to pharm and to call with any questions.

## 2020-08-31 NOTE — Progress Notes (Unsigned)
   Imaging: No results found. No images are attached to the encounter.  Labs: No results found for: HGBA1C, ESRSEDRATE, CRP, LABURIC, REPTSTATUS, GRAMSTAIN, CULT, LABORGA   No results found for: ALBUMIN, PREALBUMIN, LABURIC  No results found for: MG No results found for: VD25OH  No results found for: PREALBUMIN CBC EXTENDED Latest Ref Rng & Units 05/08/2019  WBC 4.0 - 10.5 K/uL 6.9  RBC 3.87 - 5.11 MIL/uL 4.46  HGB 12.0 - 15.0 g/dL 15.5(H)  HCT 36.0 - 46.0 % 43.8  PLT 150 - 400 K/uL 144(L)  NEUTROABS 1.7 - 7.7 K/uL 3.6  LYMPHSABS 0.7 - 4.0 K/uL 4.8(H)     There is no height or weight on file to calculate BMI.  Orders:  No orders of the defined types were placed in this encounter.  Meds ordered this encounter  Medications  . oxyCODONE-acetaminophen (PERCOCET) 5-325 MG tablet    Sig: Take 1 tablet by mouth every 4 (four) hours as needed.    Dispense:  30 tablet    Refill:  0     Procedures: No procedures performed  Clinical Data: No additional findings.  ROS:  All other systems negative, except as noted in the HPI. Review of Systems  Objective: Vital Signs: There were no vitals taken for this visit.  Specialty Comments:  No specialty comments available.  PMFS History: Patient Active Problem List   Diagnosis Date Noted  . Foot fracture, left 05/13/2019  . Foot fracture, left, open, initial encounter    Past Medical History:  Diagnosis Date  . Asthma    05/11/2019 - "hasn't flarred up in a while"    No family history on file.  Past Surgical History:  Procedure Laterality Date  . APPLICATION OF WOUND VAC Left 05/12/2019   Procedure: Application Of Wound Vac;  Surgeon: Nadara Mustard, MD;  Location: [facility] OR;  Service: Orthopedics;  Laterality: Left;  . FOOT ARTHRODESIS Left 05/17/2020   Procedure: LEFT FOOT REVISION FUSION;  Surgeon: Nadara Mustard, MD;  Location: [facility] OR;  Service: Orthopedics;  Laterality: Left;  . ORIF ANKLE FRACTURE Left 05/12/2019    Procedure: OPEN REDUCTION INTERNAL FIXATION (ORIF) LEFT FOOT;  Surgeon: Nadara Mustard, MD;  Location: [facility] OR;  Service: Orthopedics;  Laterality: Left;   Social History   Occupational History  . Not on file  Tobacco Use  . Smoking status: Current Every Day Smoker    Packs/day: 0.25    Years: 4.00    Pack years: 1.00  . Smokeless tobacco: Never Used  Vaping Use  . Vaping Use: Never used  Substance and Sexual Activity  . Alcohol use: Not Currently    Alcohol/week: 8.0 standard drinks    Types: 8 Cans of beer per week  . Drug use: Never  . Sexual activity: Not on file

## 2020-09-11 ENCOUNTER — Ambulatory Visit: Payer: Self-pay | Admitting: Orthopedic Surgery

## 2021-01-25 ENCOUNTER — Ambulatory Visit: Payer: Self-pay | Admitting: Orthopedic Surgery
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
## 2023-05-08 ENCOUNTER — Emergency Department (HOSPITAL_COMMUNITY): Payer: Self-pay

## 2023-05-08 ENCOUNTER — Emergency Department (HOSPITAL_COMMUNITY)
Admission: EM | Admit: 2023-05-08 | Discharge: 2023-05-08 | Disposition: A | Discharge status: Home / Self Care | Payer: Self-pay | Attending: Student | Admitting: Student

## 2023-05-08 ENCOUNTER — Encounter (HOSPITAL_COMMUNITY): Payer: Self-pay

## 2023-05-08 ENCOUNTER — Other Ambulatory Visit: Payer: Self-pay

## 2023-05-08 DIAGNOSIS — S99922A Unspecified injury of left foot, initial encounter: Secondary | ICD-10-CM | POA: Insufficient documentation

## 2023-05-08 DIAGNOSIS — W208XXA Other cause of strike by thrown, projected or falling object, initial encounter: Secondary | ICD-10-CM | POA: Insufficient documentation

## 2023-05-08 DIAGNOSIS — Y990 Civilian activity done for income or pay: Secondary | ICD-10-CM | POA: Insufficient documentation

## 2023-05-08 DIAGNOSIS — J45909 Unspecified asthma, uncomplicated: Secondary | ICD-10-CM | POA: Insufficient documentation

## 2023-05-08 NOTE — Discharge Instructions (Signed)
Today you were seen for an injury of your left great toe.  Please see attached instructions for routine care.  You may alternate Tylenol and Motrin as needed for pain and swelling. Thank you for letting us treat you today. After forming a physical exam and reviewing your imaging, I feel you are safe to go home. Please follow up with your PCP in the next several days and provide them with your records from this visit. Return to the Emergency Room if pain becomes severe or symptoms worsen.
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
## 2023-05-08 NOTE — ED Triage Notes (Signed)
Pt c/o left big toe pain after putting it on pallet today at work. Toe is red and swollen.

## 2023-08-17 ENCOUNTER — Other Ambulatory Visit: Payer: Self-pay

## 2023-08-17 ENCOUNTER — Encounter (HOSPITAL_COMMUNITY): Payer: Self-pay | Admitting: Emergency Medicine

## 2023-08-17 ENCOUNTER — Emergency Department (HOSPITAL_COMMUNITY)
Admission: EM | Admit: 2023-08-17 | Discharge: 2023-08-17 | Disposition: A | Discharge status: Home / Self Care | Payer: Self-pay | Attending: Emergency Medicine | Admitting: Emergency Medicine

## 2023-08-17 ENCOUNTER — Emergency Department (HOSPITAL_COMMUNITY): Payer: Self-pay

## 2023-08-17 DIAGNOSIS — J45909 Unspecified asthma, uncomplicated: Secondary | ICD-10-CM | POA: Insufficient documentation

## 2023-08-17 DIAGNOSIS — J111 Influenza due to unidentified influenza virus with other respiratory manifestations: Secondary | ICD-10-CM

## 2023-08-17 DIAGNOSIS — Z20822 Contact with and (suspected) exposure to covid-19: Secondary | ICD-10-CM | POA: Insufficient documentation

## 2023-08-17 DIAGNOSIS — J101 Influenza due to other identified influenza virus with other respiratory manifestations: Secondary | ICD-10-CM | POA: Insufficient documentation

## 2023-08-17 LAB — RESP PANEL BY RT-PCR (RSV, FLU A&B, COVID)  RVPGX2
Influenza A by PCR: POSITIVE — AB
Influenza B by PCR: NEGATIVE
Resp Syncytial Virus by PCR: NEGATIVE
SARS Coronavirus 2 by RT PCR: NEGATIVE

## 2023-08-17 MED ORDER — OSELTAMIVIR PHOSPHATE 75 MG PO CAPS: 75.0000 mg | ORAL_CAPSULE | Freq: Two times a day (BID) | ORAL | 0 refills | Status: AC

## 2023-08-17 MED ORDER — OSELTAMIVIR PHOSPHATE 75 MG PO CAPS: 75.0000 mg | ORAL_CAPSULE | Freq: Two times a day (BID) | ORAL | 0 refills | Status: DC

## 2023-08-17 MED ORDER — ALBUTEROL SULFATE HFA 108 (90 BASE) MCG/ACT IN AERS
2.0000 | INHALATION_SPRAY | RESPIRATORY_TRACT | Status: DC | PRN
  Filled 2023-08-17: qty 6.7

## 2023-08-17 NOTE — ED Triage Notes (Signed)
 Pt reports feeling sick since Friday. Endorses body aches, headache and fevers.

## 2023-08-17 NOTE — Discharge Instructions (Signed)
 Rest,  Drink plenty of fluids.  Take motrin  or tylenol  for achiness and fever reduction.  You may take the tamiflu  if you desire.  This medicine may improve your flu symptoms 1-2 days sooner.  Get rechecked for increased shortness of breath,  Increased fever or increasing weakness.  Take the inhaler medicine given - 2 puffs every 4 hours if you feel short of breath or are wheezing.

## 2023-08-17 NOTE — ED Provider Notes (Signed)
 [place] EMERGENCY DEPARTMENT AT [facility] Provider Note   CSN: [id number] Arrival date & time: 08/17/23  1110     History  Chief Complaint  Patient presents with   Generalized Body Aches    Erika Benton is a 28 y.o. female with a history of asthma, stating she never really has wheezing except when she is ill presenting with flulike symptoms which started 2 days ago.  She endorses generalized bodyaches, fever to 102, generalized headache and nonproductive cough.  She has endorsed some wheezing along with this cough.  She has had TheraFlu which she last took yesterday evening which briefly improved her symptoms.  She denies chest pain, abdominal pain, nausea vomiting, diarrhea.  The history is provided by the patient.       Home Medications Prior to Admission medications   Medication Sig Start Date End Date Taking? Authorizing Provider  ketorolac  (TORADOL ) 10 MG tablet Take 1 tablet (10 mg total) by mouth every 6 (six) hours as needed. 05/18/20   Persons, Ronal Dragon, PA  methocarbamol  (ROBAXIN ) 500 MG tablet Take 1 tablet (500 mg total) by mouth every 6 (six) hours as needed for muscle spasms. 05/24/20   Persons, Ronal Dragon, PA  oseltamivir  (TAMIFLU ) 75 MG capsule Take 1 capsule (75 mg total) by mouth every 12 (twelve) hours. 08/17/23   Kemia Wendel, PA-C      Allergies    Patient has no known allergies.    Review of Systems   Review of Systems  Constitutional:  Positive for chills and fever.  HENT:  Negative for congestion and sore throat.   Eyes: Negative.   Respiratory:  Positive for cough and wheezing. Negative for chest tightness and shortness of breath.   Cardiovascular:  Negative for chest pain.  Gastrointestinal:  Negative for abdominal pain, nausea and vomiting.  Genitourinary: Negative.   Musculoskeletal:  Positive for myalgias. Negative for arthralgias, joint swelling and neck pain.  Skin: Negative.  Negative for rash and wound.  Neurological:   Positive for headaches. Negative for dizziness, weakness, light-headedness and numbness.  Psychiatric/Behavioral: Negative.      Physical Exam Updated Vital Signs BP 137/79   Pulse 96   Temp 99.6 F (37.6 C) (Oral)   Resp 14   Ht 5' 8 (1.727 m)   Wt 52 kg   LMP 08/10/2023 (Approximate)   SpO2 98%   BMI 17.43 kg/m  Physical Exam Vitals and nursing note reviewed.  Constitutional:      Appearance: She is well-developed.  HENT:     Head: Normocephalic and atraumatic.     Mouth/Throat:     Mouth: Mucous membranes are moist.     Pharynx: No oropharyngeal exudate or posterior oropharyngeal erythema.  Eyes:     Conjunctiva/sclera: Conjunctivae normal.  Cardiovascular:     Rate and Rhythm: Normal rate and regular rhythm.     Heart sounds: Normal heart sounds.  Pulmonary:     Effort: Pulmonary effort is normal.     Breath sounds: Wheezing present.     Comments: Faint expiratory wheeze bilateral bases, no rales or rhonchi appreciated. Abdominal:     General: Bowel sounds are normal.     Palpations: Abdomen is soft.     Tenderness: There is no abdominal tenderness.  Musculoskeletal:        General: Normal range of motion.     Cervical back: Normal range of motion.  Skin:    General: Skin is warm and dry.  Neurological:     General: No focal deficit present.     Mental Status: She is alert.     ED Results / Procedures / Treatments   Labs (all labs ordered are listed, but only abnormal results are displayed) Labs Reviewed  RESP PANEL BY RT-PCR (RSV, FLU A&B, COVID)  [id number] - Abnormal; Notable for the following components:      Result Value   Influenza A by PCR POSITIVE (*)    All other components within normal limits    EKG None  Radiology DG Chest Portable 1 View Result Date: 08/17/2023 CLINICAL DATA:  Wheezing.  Flu.  Body aches.  Fevers. EXAM: PORTABLE CHEST 1 VIEW COMPARISON:  09/17/2008 FINDINGS: Lungs appear hyperexpanded. The lungs are clear without focal  pneumonia, edema, or pneumothorax. Potential trace left pleural effusion. The cardiopericardial silhouette is within normal limits for size. No acute bony abnormality. IMPRESSION: Hyperexpansion with possible trace left pleural effusion. Otherwise no acute cardiopulmonary findings. Electronically Signed   By: Camellia Candle M.D.   On: 08/17/2023 13:39    Procedures Procedures    Medications Ordered in ED Medications  albuterol  (VENTOLIN  HFA) 108 (90 Base) MCG/ACT inhaler 2 puff (has no administration in time range)    ED Course/ Medical Decision Making/ A&P                                 Medical Decision Making Patient presenting with flulike symptoms, differential diagnoses including influenza or other viral versus bacterial respiratory infection, pneumonia, bronchitis.  She does have a mild expiratory wheeze which resolved after receiving albuterol  MDI, chest x-ray is clear with no sign of pneumonia.  She is influenza A positive.  We discussed pros and cons of Tamiflu  and she would like to take this medication and has been prescribed her.  Discussed other home treatment, return precautions outlined.  Amount and/or Complexity of Data Reviewed Labs: ordered.    Details: Respiratory panel positive for influenza A Radiology: ordered.    Details: Chest x-ray negative for pneumonia  Risk Prescription drug management.           Final Clinical Impression(s) / ED Diagnoses Final diagnoses:  Influenza    Rx / DC Orders ED Discharge Orders          Ordered    oseltamivir  (TAMIFLU ) 75 MG capsule  Every 12 hours,   Status:  Discontinued        08/17/23 1351    oseltamivir  (TAMIFLU ) 75 MG capsule  Every 12 hours        08/17/23 1419              Birdena Clarity, PA-C 08/17/23 1422    Towana Ozell BROCKS, MD 08/17/23 1757
